# Patient Record
Sex: Female | Born: 1965 | Race: White | Hispanic: No | Marital: Married | Smoking: Current every day smoker
Health system: Southern US, Community
[De-identification: ages and names within clinical notes are randomized; demographics above are authoritative.]

## PROBLEM LIST (undated history)

## (undated) DIAGNOSIS — M069 Rheumatoid arthritis, unspecified: Secondary | ICD-10-CM

## (undated) DIAGNOSIS — E559 Vitamin D deficiency, unspecified: Secondary | ICD-10-CM

## (undated) HISTORY — PX: WISDOM TOOTH EXTRACTION: SHX21

---

## 2002-09-18 ENCOUNTER — Other Ambulatory Visit: Admission: RE | Admit: 2002-09-18 | Discharge: 2002-09-18 | Payer: Self-pay | Admitting: Obstetrics and Gynecology

## 2016-10-05 DIAGNOSIS — J069 Acute upper respiratory infection, unspecified: Secondary | ICD-10-CM | POA: Diagnosis not present

## 2017-01-26 DIAGNOSIS — Z01419 Encounter for gynecological examination (general) (routine) without abnormal findings: Secondary | ICD-10-CM | POA: Diagnosis not present

## 2017-01-26 DIAGNOSIS — Z6828 Body mass index (BMI) 28.0-28.9, adult: Secondary | ICD-10-CM | POA: Diagnosis not present

## 2017-01-26 DIAGNOSIS — Z1231 Encounter for screening mammogram for malignant neoplasm of breast: Secondary | ICD-10-CM | POA: Diagnosis not present

## 2017-03-05 ENCOUNTER — Telehealth: Payer: Self-pay

## 2017-03-05 ENCOUNTER — Ambulatory Visit: Payer: Self-pay | Admitting: Family Medicine

## 2017-03-05 NOTE — Telephone Encounter (Signed)
New pt called 03/05/17 at 2:58 pm to cancel new pt appt 03/05/17 at 3:30 pm ; reason given was car would not start. The appt has not been filled. Do you want to charge?

## 2017-03-05 NOTE — Telephone Encounter (Signed)
Copied from [place] [id number]. Topic: Quick Communication - Appointment Cancellation >> Mar 05, 2017  2:59 PM Conception Chancy, NT wrote: Patient called to cancel appointment scheduled for 03/05/2017. Patient has not rescheduled their appointment.    Route to department's PEC pool.

## 2017-03-05 NOTE — Telephone Encounter (Signed)
Will not charge for today's cancellation.

## 2018-04-01 ENCOUNTER — Ambulatory Visit (INDEPENDENT_AMBULATORY_CARE_PROVIDER_SITE_OTHER): Payer: BLUE CROSS/BLUE SHIELD | Admitting: Family Medicine

## 2018-04-01 ENCOUNTER — Encounter: Payer: Self-pay | Admitting: Family Medicine

## 2018-04-01 VITALS — BP 112/68 | HR 82 | Temp 98.3°F | Ht 64.75 in | Wt 173.0 lb

## 2018-04-01 DIAGNOSIS — Z720 Tobacco use: Secondary | ICD-10-CM

## 2018-04-01 DIAGNOSIS — R87619 Unspecified abnormal cytological findings in specimens from cervix uteri: Secondary | ICD-10-CM | POA: Insufficient documentation

## 2018-04-01 DIAGNOSIS — L659 Nonscarring hair loss, unspecified: Secondary | ICD-10-CM

## 2018-04-01 DIAGNOSIS — E663 Overweight: Secondary | ICD-10-CM | POA: Diagnosis not present

## 2018-04-01 DIAGNOSIS — Z0000 Encounter for general adult medical examination without abnormal findings: Secondary | ICD-10-CM | POA: Diagnosis not present

## 2018-04-01 DIAGNOSIS — E559 Vitamin D deficiency, unspecified: Secondary | ICD-10-CM

## 2018-04-01 DIAGNOSIS — Z1211 Encounter for screening for malignant neoplasm of colon: Secondary | ICD-10-CM

## 2018-04-01 DIAGNOSIS — Z23 Encounter for immunization: Secondary | ICD-10-CM

## 2018-04-01 LAB — CBC WITH DIFFERENTIAL/PLATELET
Basophils Absolute: 0 10*3/uL (ref 0.0–0.1)
Basophils Relative: 0.8 % (ref 0.0–3.0)
Eosinophils Absolute: 0 10*3/uL (ref 0.0–0.7)
Eosinophils Relative: 0.6 % (ref 0.0–5.0)
HCT: 41.8 % (ref 36.0–46.0)
HEMOGLOBIN: 14.3 g/dL (ref 12.0–15.0)
Lymphocytes Relative: 26.5 % (ref 12.0–46.0)
Lymphs Abs: 1.7 10*3/uL (ref 0.7–4.0)
MCHC: 34.1 g/dL (ref 30.0–36.0)
MCV: 91.4 fl (ref 78.0–100.0)
Monocytes Absolute: 0.4 10*3/uL (ref 0.1–1.0)
Monocytes Relative: 6.4 % (ref 3.0–12.0)
Neutro Abs: 4.2 10*3/uL (ref 1.4–7.7)
Neutrophils Relative %: 65.7 % (ref 43.0–77.0)
Platelets: 214 10*3/uL (ref 150.0–400.0)
RBC: 4.57 Mil/uL (ref 3.87–5.11)
RDW: 13.4 % (ref 11.5–15.5)
WBC: 6.4 10*3/uL (ref 4.0–10.5)

## 2018-04-01 LAB — COMPREHENSIVE METABOLIC PANEL
ALT: 22 U/L (ref 0–35)
AST: 15 U/L (ref 0–37)
Albumin: 4.4 g/dL (ref 3.5–5.2)
Alkaline Phosphatase: 72 U/L (ref 39–117)
BUN: 13 mg/dL (ref 6–23)
CO2: 26 mEq/L (ref 19–32)
CREATININE: 0.86 mg/dL (ref 0.40–1.20)
Calcium: 9.4 mg/dL (ref 8.4–10.5)
Chloride: 106 mEq/L (ref 96–112)
GFR: 73.06 mL/min (ref >60.00)
Glucose, Bld: 99 mg/dL (ref 70–99)
Potassium: 4.1 mEq/L (ref 3.5–5.1)
Sodium: 141 mEq/L (ref 135–145)
Total Bilirubin: 0.5 mg/dL (ref 0.2–1.2)
Total Protein: 6.9 g/dL (ref 6.0–8.3)

## 2018-04-01 LAB — LIPID PANEL
Cholesterol: 206 mg/dL — ABNORMAL HIGH (ref 0–200)
HDL: 49.3 mg/dL (ref >39.00)
LDL Cholesterol: 131 mg/dL — ABNORMAL HIGH (ref 0–99)
NonHDL: 157.03
Total CHOL/HDL Ratio: 4
Triglycerides: 128 mg/dL (ref 0.0–149.0)
VLDL: 25.6 mg/dL (ref 0.0–40.0)

## 2018-04-01 LAB — TSH: TSH: 1.66 u[IU]/mL (ref 0.35–4.50)

## 2018-04-01 LAB — VITAMIN D 25 HYDROXY (VIT D DEFICIENCY, FRACTURES): VITD: 18.07 ng/mL — ABNORMAL LOW (ref 30.00–100.00)

## 2018-04-01 NOTE — Patient Instructions (Signed)
Good to meet you today  I will send you a letter with your lab results  You will get a call about scheduling your colonoscopy  Please follow up for annual exam in 1 year   Coping with Quitting Smoking Quitting smoking is a physical and mental challenge. You will face cravings, withdrawal symptoms, and temptation. Before quitting, work with your health care provider to make a plan that can help you cope. Preparation can help you quit and keep you from giving in. How can I cope with cravings? Cravings usually last for 5-10 minutes. If you get through it, the craving will pass. Consider taking the following actions to help you cope with cravings:  Keep your mouth busy: ? Chew sugar-free gum. ? Suck on hard candies or a straw. ? Brush your teeth.  Keep your hands and body busy: ? Immediately change to a different activity when you feel a craving. ? Squeeze or play with a ball. ? Do an activity or a hobby, like making bead jewelry, practicing needlepoint, or working with wood. ? Mix up your normal routine. ? Take a short exercise break. Go for a quick walk or run up and down stairs. ? Spend time in public places where smoking is not allowed.  Focus on doing something kind or helpful for someone else.  Call a friend or family member to talk during a craving.  Join a support group.  Call a quit line, such as 1-800-QUIT-NOW.  Talk with your health care provider about medicines that might help you cope with cravings and make quitting easier for you.  How can I deal with withdrawal symptoms? Your body may experience negative effects as it tries to get used to not having nicotine in the system. These effects are called withdrawal symptoms. They may include:  Feeling hungrier than normal.  Trouble concentrating.  Irritability.  Trouble sleeping.  Feeling depressed.  Restlessness and agitation.  Craving a cigarette.  To manage withdrawal symptoms:  Avoid places, people, and  activities that trigger your cravings.  Remember why you want to quit.  Get plenty of sleep.  Avoid coffee and other caffeinated drinks. These may worsen some of your symptoms.  How can I handle social situations? Social situations can be difficult when you are quitting smoking, especially in the first few weeks. To manage this, you can:  Avoid parties, bars, and other social situations where people might be smoking.  Avoid alcohol.  Leave right away if you have the urge to smoke.  Explain to your family and friends that you are quitting smoking. Ask for understanding and support.  Plan activities with friends or family where smoking is not an option.  What are some ways I can cope with stress? Wanting to smoke may cause stress, and stress can make you want to smoke. Find ways to manage your stress. Relaxation techniques can help. For example:  Breathe slowly and deeply, in through your nose and out through your mouth.  Listen to soothing, relaxing music.  Talk with a family member or friend about your stress.  Light a candle.  Soak in a bath or take a shower.  Think about a peaceful place.  What are some ways I can prevent weight gain? Be aware that many people gain weight after they quit smoking. However, not everyone does. To keep from gaining weight, have a plan in place before you quit and stick to the plan after you quit. Your plan should include:  Having healthy  snacks. When you have a craving, it may help to: ? Eat plain popcorn, crunchy carrots, celery, or other cut vegetables. ? Chew sugar-free gum.  Changing how you eat: ? Eat small portion sizes at meals. ? Eat 4-6 small meals throughout the day instead of 1-2 large meals a day. ? Be mindful when you eat. Do not watch television or do other things that might distract you as you eat.  Exercising regularly: ? Make time to exercise each day. If you do not have time for a long workout, do short bouts of  exercise for 5-10 minutes several times a day. ? Do some form of strengthening exercise, like weight lifting, and some form of aerobic exercise, like running or swimming.  Drinking plenty of water or other low-calorie or no-calorie drinks. Drink 6-8 glasses of water daily, or as much as instructed by your health care provider.  Summary  Quitting smoking is a physical and mental challenge. You will face cravings, withdrawal symptoms, and temptation to smoke again. Preparation can help you as you go through these challenges.  You can cope with cravings by keeping your mouth busy (such as by chewing gum), keeping your body and hands busy, and making calls to family, friends, or a helpline for people who want to quit smoking.  You can cope with withdrawal symptoms by avoiding places where people smoke, avoiding drinks with caffeine, and getting plenty of rest.  Ask your health care provider about the different ways to prevent weight gain, avoid stress, and handle social situations. This information is not intended to replace advice given to you by your health care provider. Make sure you discuss any questions you have with your health care provider. Document Released: 04/14/2016 Document Revised: 04/14/2016 Document Reviewed: 04/14/2016 Elsevier Interactive Patient Education  2018 [place] Years, Female Preventive care refers to lifestyle choices and visits with your health care provider that can promote health and wellness. What does preventive care include?  A yearly physical exam. This is also called an annual well check.  Dental exams once or twice a year.  Routine eye exams. Ask your health care provider how often you should have your eyes checked.  Personal lifestyle choices, including: ? Daily care of your teeth and gums. ? Regular physical activity. ? Eating a healthy diet. ? Avoiding tobacco and drug use. ? Limiting alcohol use. ? Practicing safe  sex. ? Taking low-dose aspirin daily starting at age 32. ? Taking vitamin and mineral supplements as recommended by your health care provider. What happens during an annual well check? The services and screenings done by your health care provider during your annual well check will depend on your age, overall health, lifestyle risk factors, and family history of disease. Counseling Your health care provider may ask you questions about your:  Alcohol use.  Tobacco use.  Drug use.  Emotional well-being.  Home and relationship well-being.  Sexual activity.  Eating habits.  Work and work Statistician.  Method of birth control.  Menstrual cycle.  Pregnancy history.  Screening You may have the following tests or measurements:  Height, weight, and BMI.  Blood pressure.  Lipid and cholesterol levels. These may be checked every 5 years, or more frequently if you are over 1 years old.  Skin check.  Lung cancer screening. You may have this screening every year starting at age 59 if you have a 30-pack-year history of smoking and currently smoke or have quit within the past  15 years.  Fecal occult blood test (FOBT) of the stool. You may have this test every year starting at age 3.  Flexible sigmoidoscopy or colonoscopy. You may have a sigmoidoscopy every 5 years or a colonoscopy every 10 years starting at age 35.  Hepatitis C blood test.  Hepatitis B blood test.  Sexually transmitted disease (STD) testing.  Diabetes screening. This is done by checking your blood sugar (glucose) after you have not eaten for a while (fasting). You may have this done every 1-3 years.  Mammogram. This may be done every 1-2 years. Talk to your health care provider about when you should start having regular mammograms. This may depend on whether you have a family history of breast cancer.  BRCA-related cancer screening. This may be done if you have a family history of breast, ovarian, tubal, or  peritoneal cancers.  Pelvic exam and Pap test. This may be done every 3 years starting at age 74. Starting at age 63, this may be done every 5 years if you have a Pap test in combination with an HPV test.  Bone density scan. This is done to screen for osteoporosis. You may have this scan if you are at high risk for osteoporosis.  Discuss your test results, treatment options, and if necessary, the need for more tests with your health care provider. Vaccines Your health care provider may recommend certain vaccines, such as:  Influenza vaccine. This is recommended every year.  Tetanus, diphtheria, and acellular pertussis (Tdap, Td) vaccine. You may need a Td booster every 10 years.  Varicella vaccine. You may need this if you have not been vaccinated.  Zoster vaccine. You may need this after age 54.  Measles, mumps, and rubella (MMR) vaccine. You may need at least one dose of MMR if you were born in 1957 or later. You may also need a second dose.  Pneumococcal 13-valent conjugate (PCV13) vaccine. You may need this if you have certain conditions and were not previously vaccinated.  Pneumococcal polysaccharide (PPSV23) vaccine. You may need one or two doses if you smoke cigarettes or if you have certain conditions.  Meningococcal vaccine. You may need this if you have certain conditions.  Hepatitis A vaccine. You may need this if you have certain conditions or if you travel or work in places where you may be exposed to hepatitis A.  Hepatitis B vaccine. You may need this if you have certain conditions or if you travel or work in places where you may be exposed to hepatitis B.  Haemophilus influenzae type b (Hib) vaccine. You may need this if you have certain conditions.  Talk to your health care provider about which screenings and vaccines you need and how often you need them. This information is not intended to replace advice given to you by your health care provider. Make sure you  discuss any questions you have with your health care provider. Document Released: 05/14/2015 Document Revised: 01/05/2016 Document Reviewed: 02/16/2015 Elsevier Interactive Patient Education  [employer].

## 2018-04-01 NOTE — Progress Notes (Signed)
Subjective:    Patient ID: Jasmine Wade, female    DOB: Apr 19, 1966, 52 y.o.   MRN: [id number]  HPI This is a 52 year old female who presents today to establish care and for cpe. Has 7 kids, has two teens at home. Five grandchildren. She works in Press photographer for [employer]. Enjoys reading.    Last CPE- annual gyn, has appointment 04/07/18 Mammo- annual at gyn Pap- per gyn Colonoscopy- never, willing Tdap-unknown, today Flu- declines Eye- annual Dental- not recently Exercise- not regular, housework  History reviewed. No pertinent past medical history. History reviewed. No pertinent surgical history. Family History  Problem Relation Age of Onset  . Arthritis Mother    Social History   Tobacco Use  . Smoking status: Current Every Day Smoker  . Smokeless tobacco: Never Used  Substance Use Topics  . Alcohol use: Yes    Comment: occ  . Drug use: Not Currently    Comment: back in her 20's       Review of Systems  Constitutional: Negative.   HENT: Negative.   Eyes: Negative.   Respiratory: Negative.   Cardiovascular: Negative.   Gastrointestinal: Positive for diarrhea (occasional, no more than 1x/ daily, no blood, no mucus) and nausea (occasionally before diarrhea, always resolved with BM).  Endocrine: Negative.   Genitourinary: Negative.   Musculoskeletal: Negative.   Skin:       Mole on forehead x several years, per dermatology "black mole." No growth, no scaling or drainage/bleeding .  Allergic/Immunologic: Positive for environmental allergies (spring/fall, gets relief with otc antihistamines).  Neurological: Negative.   Hematological: Negative.   Psychiatric/Behavioral: Negative.        Objective:   Physical Exam Physical Exam  Constitutional: She is oriented to person, place, and time. She appears well-developed and well-nourished. No distress.  HENT:  Head: Normocephalic and atraumatic.  Right Ear: External ear normal.  Left Ear: External ear normal.    Nose: Nose normal.  Mouth/Throat: Oropharynx is clear and moist. No oropharyngeal exudate.  Eyes: Conjunctivae are normal. Pupils are equal, round, and reactive to light.  Neck: Normal range of motion. Neck supple. No JVD present. No thyromegaly present.  Cardiovascular: Normal rate, regular rhythm, normal heart sounds and intact distal pulses.   Pulmonary/Chest: Effort normal and breath sounds normal. Right breast exhibits no inverted nipple, no mass, no nipple discharge, no skin change and no tenderness. Left breast exhibits no inverted nipple, no mass, no nipple discharge, no skin change and no tenderness. Breasts are symmetrical.  Abdominal: Soft. Bowel sounds are normal. She exhibits no distension and no mass. There is no tenderness. There is no rebound and no guarding.  Musculoskeletal: Normal range of motion. She exhibits no edema or tenderness.  Lymphadenopathy:    She has no cervical adenopathy.  Neurological: She is alert and oriented to person, place, and time. She has normal reflexes.  Skin: Skin is warm and dry. She is not diaphoretic. Hair thin. Center of forehead at hairline with 2 mm round, dark, raised area.  Psychiatric: She has a normal mood and affect. Her behavior is normal. Judgment and thought content normal.  Vitals reviewed.   Blood pressure 112/68, pulse 82, temperature 98.3 F (36.8 C), temperature source Oral, height 5' 4.75" (1.645 m), weight 173 lb (78.5 kg), SpO2 98 %.      Assessment & Plan:  1. Annual physical exam - Discussed and encouraged healthy lifestyle choices- adequate sleep, regular exercise, stress management and healthy food choices.  2. Need for Tdap vaccination - Tdap vaccine greater than or equal to 7yo IM  3. Tobacco abuse - she is contemplating cessation, provided encouragement and written tips for success  4. Overweight (BMI 25.0-29.9) - Comprehensive metabolic panel - CBC with Differential - TSH - Vitamin D, 25-hydroxy - Lipid  Panel  5. Thinning hair - Comprehensive metabolic panel - CBC with Differential - TSH  6. Colon cancer screening - Ambulatory referral to Gastroenterology   Clarene Reamer, FNP-BC  [facility], [facility]  04/01/2018 2:02 PM

## 2018-04-03 ENCOUNTER — Encounter: Payer: Self-pay | Admitting: Family Medicine

## 2018-04-03 MED ORDER — VITAMIN D3 1.25 MG (50000 UT) PO TABS: 1.0000 | ORAL_TABLET | ORAL | 1 refills | Status: DC

## 2018-04-03 NOTE — Addendum Note (Signed)
Addended by: Clarene Reamer B on: 04/03/2018 11:35 AM   Modules accepted: Orders

## 2018-04-16 ENCOUNTER — Encounter: Payer: Self-pay | Admitting: Gastroenterology

## 2018-04-16 ENCOUNTER — Telehealth: Payer: Self-pay | Admitting: Family Medicine

## 2018-04-16 NOTE — Telephone Encounter (Signed)
Left message asking pt to call office regarding referral

## 2018-04-29 ENCOUNTER — Ambulatory Visit (AMBULATORY_SURGERY_CENTER): Payer: Self-pay

## 2018-04-29 VITALS — Ht 65.0 in | Wt 176.2 lb

## 2018-04-29 DIAGNOSIS — Z1211 Encounter for screening for malignant neoplasm of colon: Secondary | ICD-10-CM

## 2018-04-29 MED ORDER — NA SULFATE-K SULFATE-MG SULF 17.5-3.13-1.6 GM/177ML PO SOLN: 1.0000 | Freq: Once | ORAL | 0 refills | Status: AC

## 2018-04-29 NOTE — Progress Notes (Signed)
No egg or soy allergy known to patient  No issues with past sedation with any surgeries  or procedures, no intubation problems  No diet pills per patient No home 02 use per patient  No blood thinners per patient  Pt denies issues with constipation  No A fib or A flutter  EMMI video sent to pt's e mail  Pt. declined 

## 2018-05-06 ENCOUNTER — Encounter: Payer: Self-pay | Admitting: Gastroenterology

## 2018-05-14 ENCOUNTER — Encounter: Payer: Self-pay | Admitting: Gastroenterology

## 2018-05-14 ENCOUNTER — Ambulatory Visit (AMBULATORY_SURGERY_CENTER): Payer: BLUE CROSS/BLUE SHIELD | Admitting: Gastroenterology

## 2018-05-14 VITALS — BP 105/76 | HR 68 | Temp 97.5°F | Resp 13 | Ht 65.0 in | Wt 176.0 lb

## 2018-05-14 DIAGNOSIS — Z1211 Encounter for screening for malignant neoplasm of colon: Secondary | ICD-10-CM

## 2018-05-14 DIAGNOSIS — D123 Benign neoplasm of transverse colon: Secondary | ICD-10-CM

## 2018-05-14 DIAGNOSIS — D125 Benign neoplasm of sigmoid colon: Secondary | ICD-10-CM | POA: Diagnosis not present

## 2018-05-14 MED ORDER — SODIUM CHLORIDE 0.9 % IV SOLN: 500.0000 mL | Freq: Once | INTRAVENOUS | Status: DC

## 2018-05-14 NOTE — Progress Notes (Signed)
Pt's states no medical or surgical changes since previsit or office visit. 

## 2018-05-14 NOTE — Patient Instructions (Signed)
YOU HAD AN ENDOSCOPIC PROCEDURE TODAY AT [facility]:   Refer to the procedure report that was given to you for any specific questions about what was found during the examination.  If the procedure report does not answer your questions, please call your gastroenterologist to clarify.  If you requested that your care partner not be given the details of your procedure findings, then the procedure report has been included in a sealed envelope for you to review at your convenience later.  YOU SHOULD EXPECT: Some feelings of bloating in the abdomen. Passage of more gas than usual.  Walking can help get rid of the air that was put into your GI tract during the procedure and reduce the bloating. If you had a lower endoscopy (such as a colonoscopy or flexible sigmoidoscopy) you may notice spotting of blood in your stool or on the toilet paper. If you underwent a bowel prep for your procedure, you may not have a normal bowel movement for a few days.  Please Note:  You might notice some irritation and congestion in your nose or some drainage.  This is from the oxygen used during your procedure.  There is no need for concern and it should clear up in a day or so.  SYMPTOMS TO REPORT IMMEDIATELY:   Following lower endoscopy (colonoscopy or flexible sigmoidoscopy):  Excessive amounts of blood in the stool  Significant tenderness or worsening of abdominal pains  Swelling of the abdomen that is new, acute  Fever of 100F or higher   For urgent or emergent issues, a gastroenterologist can be reached at any hour by calling [phone].  No NSAIDs, ibuprofen, motrin or aleve for 2 weeks.   DIET:  We do recommend a small meal at first, but then you may proceed to your regular diet.  Drink plenty of fluids but you should avoid alcoholic beverages for 24 hours.  ACTIVITY:  You should plan to take it easy for the rest of today and you should NOT DRIVE or use heavy machinery until tomorrow  (because of the sedation medicines used during the test).    FOLLOW UP: Our staff will call the number listed on your records the next business day following your procedure to check on you and address any questions or concerns that you may have regarding the information given to you following your procedure. If we do not reach you, we will leave a message.  However, if you are feeling well and you are not experiencing any problems, there is no need to return our call.  We will assume that you have returned to your regular daily activities without incident.  If any biopsies were taken you will be contacted by phone or by letter within the next 1-3 weeks.  Please call us at [phone] if you have not heard about the biopsies in 3 weeks.    SIGNATURES/CONFIDENTIALITY: You and/or your care partner have signed paperwork which will be entered into your electronic medical record.  These signatures attest to the fact that that the information above on your After Visit Summary has been reviewed and is understood.  Full responsibility of the confidentiality of this discharge information lies with you and/or your care-partner.  Thank you for letting us take care of your healthcare needs today.

## 2018-05-14 NOTE — Op Note (Signed)
[facility] Patient Name: Jasmine Wade Procedure Date: 05/14/2018 11:58 AM MRN: [id number] Endoscopist: Mauri Pole , MD Age: 53 Referring MD:  Date of Birth: 05/17/1965 Gender: Female Account #: [id number] Procedure:                Colonoscopy Indications:              Screening for colorectal malignant neoplasm Medicines:                Monitored Anesthesia Care Procedure:                Pre-Anesthesia Assessment:                           - Prior to the procedure, a History and Physical                            was performed, and patient medications and                            allergies were reviewed. The patient's tolerance of                            previous anesthesia was also reviewed. The risks                            and benefits of the procedure and the sedation                            options and risks were discussed with the patient.                            All questions were answered, and informed consent                            was obtained. Prior Anticoagulants: The patient has                            taken no previous anticoagulant or antiplatelet                            agents. ASA Grade Assessment: I - A normal, healthy                            patient. After reviewing the risks and benefits,                            the patient was deemed in satisfactory condition to                            undergo the procedure.                           After obtaining informed consent, the colonoscope  was passed under direct vision. Throughout the                            procedure, the patient's blood pressure, pulse, and                            oxygen saturations were monitored continuously. The                            Colonoscope was introduced through the anus and                            advanced to the the cecum, identified by                            appendiceal orifice and  ileocecal valve. The                            colonoscopy was performed without difficulty. The                            patient tolerated the procedure well. The quality                            of the bowel preparation was fair. The ileocecal                            valve, appendiceal orifice, and rectum were                            photographed. Scope In: 12:05:41 PM Scope Out: 12:20:36 PM Scope Withdrawal Time: 0 hours 11 minutes 41 seconds  Total Procedure Duration: 0 hours 14 minutes 55 seconds  Findings:                 The perianal and digital rectal examinations were                            normal.                           Two sessile polyps were found in the sigmoid colon                            and transverse colon. The polyps were 1 to 2 mm in                            size. These polyps were removed with a cold biopsy                            forceps. Resection and retrieval were complete.                           A 4 mm polyp was found in the sigmoid colon. The  polyp was semi-pedunculated and oozing small amount                            of heme. The polyp was removed with a hot snare.                            Resection and retrieval were complete.                           A 5 mm polyp was found in the sigmoid colon. The                            polyp was sessile. The polyp was removed with a                            cold snare. Resection and retrieval were complete.                           Scattered small-mouthed diverticula were found in                            the sigmoid colon and descending colon.                           Non-bleeding internal hemorrhoids were found during                            retroflexion. The hemorrhoids were small.                           A single small localized angiodysplastic lesion                            without bleeding was found in the ascending colon. Complications:             No immediate complications. Estimated Blood Loss:     Estimated blood loss was minimal. Impression:               - Preparation of the colon was fair.                           - Two 1 to 2 mm polyps in the sigmoid colon and in                            the transverse colon, removed with a cold biopsy                            forceps. Resected and retrieved.                           - One 4 mm polyp in the sigmoid colon, removed with                            a hot  snare. Resected and retrieved.                           - One 5 mm polyp in the sigmoid colon, removed with                            a cold snare. Resected and retrieved.                           - Diverticulosis in the sigmoid colon and in the                            descending colon.                           - Non-bleeding internal hemorrhoids.                           - A single non-bleeding colonic angiodysplastic                            lesion. Recommendation:           - Patient has a contact number available for                            emergencies. The signs and symptoms of potential                            delayed complications were discussed with the                            patient. Return to normal activities tomorrow.                            Written discharge instructions were provided to the                            patient.                           - Resume previous diet.                           - Continue present medications.                           - Await pathology results.                           - Repeat colonoscopy in 1 year because the bowel                            preparation was suboptimal.                           - For future colonoscopy the patient will require  an extended preparation. If there are any                            questions, please contact the gastroenterologist.                           - No aspirin,  ibuprofen, naproxen, or other                            non-steroidal anti-inflammatory drugs for 2 weeks. Mauri Pole, MD 05/14/2018 12:30:06 PM This report has been signed electronically.

## 2018-05-14 NOTE — Progress Notes (Signed)
Called to room to assist during endoscopic procedure.  Patient ID and intended procedure confirmed with present staff. Received instructions for my participation in the procedure from the performing physician.  

## 2018-05-15 ENCOUNTER — Telehealth: Payer: Self-pay

## 2018-05-15 NOTE — Telephone Encounter (Signed)
  Follow up Call-  Call back number 05/14/2018  Post procedure Call Back phone  # [phone]  Permission to leave phone message Yes  Some recent data might be hidden    No ID on voicemail.  No message left.  Will try again around lunchtime.

## 2018-05-21 ENCOUNTER — Encounter: Payer: Self-pay | Admitting: Gastroenterology

## 2018-06-25 ENCOUNTER — Ambulatory Visit (HOSPITAL_COMMUNITY)
Admission: EM | Admit: 2018-06-25 | Discharge: 2018-06-25 | Disposition: A | Discharge status: Home / Self Care | Payer: BLUE CROSS/BLUE SHIELD | Attending: Emergency Medicine | Admitting: Emergency Medicine

## 2018-06-25 ENCOUNTER — Encounter (HOSPITAL_COMMUNITY): Payer: Self-pay

## 2018-06-25 DIAGNOSIS — H6532 Chronic mucoid otitis media, left ear: Secondary | ICD-10-CM

## 2018-06-25 DIAGNOSIS — J321 Chronic frontal sinusitis: Secondary | ICD-10-CM | POA: Diagnosis not present

## 2018-06-25 MED ORDER — PREDNISONE 10 MG (21) PO TBPK: ORAL_TABLET | Freq: Every day | ORAL | 0 refills | Status: DC

## 2018-06-25 MED ORDER — AMOXICILLIN 500 MG PO CAPS: 500.0000 mg | ORAL_CAPSULE | Freq: Three times a day (TID) | ORAL | 0 refills | Status: DC

## 2018-06-25 NOTE — ED Triage Notes (Signed)
Pt presents with sinus issues; facial pain/pressure, nasal drainage with green mucus, and ear pain X 8 days.

## 2018-06-25 NOTE — ED Provider Notes (Signed)
[facility]    CSN: [id number] Arrival date & time: 06/25/18  1708     History   Chief Complaint Chief Complaint  Patient presents with  . Sinus Issues    HPI Jasmine Wade is a 53 y.o. female.   Sinus pressure, fever, bil ear pain and pressure, generalized not feeling well. Has had chronic ear pain and sinus infections for years does see and ent. Has not been feeling well for the past 8 days now. Has tried otc meds with no relief.      History reviewed. No pertinent past medical history.  Patient Active Problem List   Diagnosis Date Noted  . Abnormal Pap smear of cervix 04/01/2018    Past Surgical History:  Procedure Laterality Date  . WISDOM TOOTH EXTRACTION      OB History   No obstetric history on file.      Home Medications    Prior to Admission medications   Medication Sig Start Date End Date Taking? Authorizing Provider  amoxicillin (AMOXIL) 500 MG capsule Take 1 capsule (500 mg total) by mouth 3 (three) times daily. 06/25/18   Marney Setting, NP  Cholecalciferol (VITAMIN D3) 1.25 MG (50000 UT) TABS Take 1 tablet by mouth every 7 (seven) days. Get blood work checked after taking for 6 months 04/03/18   Elby Beck, FNP  predniSONE (STERAPRED UNI-PAK 21 TAB) 10 MG (21) TBPK tablet Take by mouth daily. Take 6 tabs by mouth daily  for 2 days, then 5 tabs for 2 days, then 4 tabs for 2 days, then 3 tabs for 2 days, 2 tabs for 2 days, then 1 tab by mouth daily for 2 days 06/25/18   Marney Setting, NP    Family History Family History  Problem Relation Age of Onset  . Arthritis Mother   . Colon polyps Neg Hx   . Colon cancer Neg Hx   . Esophageal cancer Neg Hx   . Rectal cancer Neg Hx   . Stomach cancer Neg Hx     Social History Social History   Tobacco Use  . Smoking status: Current Every Day Smoker  . Smokeless tobacco: Never Used  Substance Use Topics  . Alcohol use: Yes    Comment: occ  . Drug use: Not Currently   Comment: back in her 20's      Allergies   Sulfa antibiotics   Review of Systems Review of Systems  Constitutional: Positive for fatigue and fever.  HENT: Positive for congestion, ear pain, sinus pressure and sinus pain.   Eyes: Negative.   Respiratory: Positive for cough.   Cardiovascular: Negative.   Gastrointestinal: Negative.   Genitourinary: Negative.   Musculoskeletal: Negative.   Skin: Negative.   Neurological: Negative.      Physical Exam Triage Vital Signs ED Triage Vitals  Enc Vitals Group     BP 06/25/18 1811 138/87     Pulse Rate 06/25/18 1811 88     Resp 06/25/18 1811 20     Temp 06/25/18 1811 98.4 F (36.9 C)     Temp Source 06/25/18 1811 Oral     SpO2 06/25/18 1811 100 %     Weight --      Height --      Head Circumference --      Peak Flow --      Pain Score 06/25/18 1812 7     Pain Loc --      Pain Edu? --  Excl. in GC? --    No data found.  Updated Vital Signs BP 138/87 (BP Location: Right Arm)   Pulse 88   Temp 98.4 F (36.9 C) (Oral)   Resp 20   SpO2 100%   Visual Acuity Right Eye Distance:   Left Eye Distance:   Bilateral Distance:    Right Eye Near:   Left Eye Near:    Bilateral Near:     Physical Exam HENT:     Right Ear: There is impacted cerumen.     Left Ear: There is impacted cerumen.     Ears:     Comments: bil ear buldging, erythema,     Mouth/Throat:     Mouth: Mucous membranes are moist.  Eyes:     Pupils: Pupils are equal, round, and reactive to light.  Neck:     Musculoskeletal: Normal range of motion.  Cardiovascular:     Rate and Rhythm: Normal rate.     Pulses: Normal pulses.  Pulmonary:     Effort: Pulmonary effort is normal.  Abdominal:     General: Abdomen is flat.  Skin:    General: Skin is warm.     Capillary Refill: Capillary refill takes less than 2 seconds.  Neurological:     Mental Status: She is alert.      UC Treatments / Results  Labs (all labs ordered are listed, but only  abnormal results are displayed) Labs Reviewed - No data to display  EKG None  Radiology No results found.  Procedures Procedures (including critical care time)  Medications Ordered in UC Medications - No data to display  Initial Impression / Assessment and Plan / UC Course  I have reviewed the triage vital signs and the nursing notes.  Pertinent labs & imaging results that were available during my care of the patient were reviewed by me and considered in my medical decision making (see chart for details).    Will need to see ent for further problems  Cont to take daily allergy meds  may use nasal spray to help   Final Clinical Impressions(s) / UC Diagnoses   Final diagnoses:  Chronic frontal sinusitis  Chronic mucoid otitis media of left ear   Discharge Instructions   None    ED Prescriptions    Medication Sig Dispense Auth. Provider   amoxicillin (AMOXIL) 500 MG capsule Take 1 capsule (500 mg total) by mouth 3 (three) times daily. 21 capsule Morley Kos L, NP   predniSONE (STERAPRED UNI-PAK 21 TAB) 10 MG (21) TBPK tablet Take by mouth daily. Take 6 tabs by mouth daily  for 2 days, then 5 tabs for 2 days, then 4 tabs for 2 days, then 3 tabs for 2 days, 2 tabs for 2 days, then 1 tab by mouth daily for 2 days 42 tablet Marney Setting, NP     Controlled Substance Prescriptions [place] Controlled Substance Registry consulted? Not Applicable   Marney Setting, NP 06/25/18 1901

## 2018-06-28 ENCOUNTER — Telehealth (HOSPITAL_COMMUNITY): Payer: Self-pay

## 2018-10-30 ENCOUNTER — Telehealth: Payer: Self-pay

## 2018-10-30 NOTE — Telephone Encounter (Signed)
Left detailed VM w COVID screen and back door lab info   

## 2018-10-31 ENCOUNTER — Other Ambulatory Visit: Payer: Self-pay

## 2018-10-31 ENCOUNTER — Other Ambulatory Visit (INDEPENDENT_AMBULATORY_CARE_PROVIDER_SITE_OTHER): Payer: 59

## 2018-10-31 DIAGNOSIS — E559 Vitamin D deficiency, unspecified: Secondary | ICD-10-CM | POA: Diagnosis not present

## 2018-10-31 LAB — VITAMIN D 25 HYDROXY (VIT D DEFICIENCY, FRACTURES): VITD: 34.01 ng/mL (ref 30.00–100.00)

## 2019-07-20 ENCOUNTER — Other Ambulatory Visit: Payer: Self-pay

## 2019-07-20 ENCOUNTER — Encounter (HOSPITAL_COMMUNITY): Payer: Self-pay | Admitting: Emergency Medicine

## 2019-07-20 ENCOUNTER — Emergency Department (HOSPITAL_COMMUNITY): Payer: Self-pay

## 2019-07-20 ENCOUNTER — Emergency Department (HOSPITAL_COMMUNITY)
Admission: EM | Admit: 2019-07-20 | Discharge: 2019-07-20 | Disposition: A | Discharge status: Home / Self Care | Payer: Self-pay | Attending: Emergency Medicine | Admitting: Emergency Medicine

## 2019-07-20 DIAGNOSIS — M79641 Pain in right hand: Secondary | ICD-10-CM

## 2019-07-20 DIAGNOSIS — M25561 Pain in right knee: Secondary | ICD-10-CM

## 2019-07-20 DIAGNOSIS — F17200 Nicotine dependence, unspecified, uncomplicated: Secondary | ICD-10-CM | POA: Insufficient documentation

## 2019-07-20 HISTORY — DX: Vitamin D deficiency, unspecified: E55.9

## 2019-07-20 MED ORDER — ACETAMINOPHEN 325 MG PO TABS
650.0000 mg | ORAL_TABLET | Freq: Once | ORAL | Status: AC
  Administered 2019-07-20: 650 mg via ORAL
  Filled 2019-07-20: qty 2

## 2019-07-20 NOTE — ED Provider Notes (Signed)
[facility] EMERGENCY DEPARTMENT Provider Note   CSN: [id number] Arrival date & time: 07/20/19  0747     History Chief Complaint  Patient presents with  . Knee Pain  . Hand numbness    Jasmine Wade is a 54 y.o. female.  Jasmine Wade is a 54 y.o. female with a history of vitamin D deficiency, who presents to the ED for evaluation of right knee pain.  Patient states this pain began yesterday and she denies any inciting injury or trauma to the knee.  Pain is well localized to the lateral aspect of the right knee at the joint line, she has not noticed any swelling, redness or warmth.  She denies any pain in the back of the knee or the calf.  She is able to bend and straighten the knee, and has been ambulatory on it although states is painful.  She states that she took 800 mg of ibuprofen with decent improvement in her discomfort.  She also reports that she has had some pain in her right hand over the past few weeks that is worse at night she states when she wakes up she often has a tingliness or numbness in her hand, she thinks it is most prevalent in the second and third fingers but is not entirely sure.  She denies any swelling or color change to the hand.  She states pain seems to get worse throughout the day and is especially worse at night.  No other aggravating or alleviating factors.  She has not seen her PCP or any other doctors regarding the symptoms.        Past Medical History:  Diagnosis Date  . Vitamin D deficiency     Patient Active Problem List   Diagnosis Date Noted  . Abnormal Pap smear of cervix 04/01/2018    Past Surgical History:  Procedure Laterality Date  . WISDOM TOOTH EXTRACTION       OB History   No obstetric history on file.     Family History  Problem Relation Age of Onset  . Arthritis Mother   . Colon polyps Neg Hx   . Colon cancer Neg Hx   . Esophageal cancer Neg Hx   . Rectal cancer Neg Hx   . Stomach cancer Neg Hx       Social History   Tobacco Use  . Smoking status: Current Every Day Smoker  . Smokeless tobacco: Never Used  Substance Use Topics  . Alcohol use: Yes    Comment: occ  . Drug use: Not Currently    Comment: back in her 20's     Home Medications Prior to Admission medications   Medication Sig Start Date End Date Taking? Authorizing Provider  amoxicillin (AMOXIL) 500 MG capsule Take 1 capsule (500 mg total) by mouth 3 (three) times daily. 06/25/18   Marney Setting, NP  Cholecalciferol (VITAMIN D3) 1.25 MG (50000 UT) TABS Take 1 tablet by mouth every 7 (seven) days. Get blood work checked after taking for 6 months 04/03/18   Elby Beck, FNP  predniSONE (STERAPRED UNI-PAK 21 TAB) 10 MG (21) TBPK tablet Take by mouth daily. Take 6 tabs by mouth daily  for 2 days, then 5 tabs for 2 days, then 4 tabs for 2 days, then 3 tabs for 2 days, 2 tabs for 2 days, then 1 tab by mouth daily for 2 days 06/25/18   Marney Setting, NP    Allergies  Sulfa antibiotics  Review of Systems   Review of Systems  Constitutional: Negative for chills and fever.  Cardiovascular: Negative for leg swelling.  Musculoskeletal: Positive for arthralgias and myalgias.  Skin: Negative for color change, rash and wound.  Neurological: Negative for weakness and numbness.       Tingling    Physical Exam Updated Vital Signs BP 114/86 (BP Location: Right Arm)   Pulse 83   Temp 98.3 F (36.8 C) (Oral)   Resp 14   SpO2 100%   Physical Exam Vitals and nursing note reviewed.  Constitutional:      General: She is not in acute distress.    Appearance: Normal appearance. She is well-developed and normal weight. She is not ill-appearing or diaphoretic.  HENT:     Head: Normocephalic and atraumatic.  Eyes:     General:        Right eye: No discharge.        Left eye: No discharge.  Pulmonary:     Effort: Pulmonary effort is normal. No respiratory distress.  Musculoskeletal:     Comments: Right  knee with localized point tenderness over the lateral aspect of the knee at the joint line, no palpable deformity, no overlying erythema, or swelling.  Patient is able to fully range the knee with some discomfort, distal pulses intact, no pain over the calf or posterior knee. Pain in the right hand is currently mild and not reproducible on exam, she does have some pain reproduced with Phalen's test, she has 2+ radial pulse and good capillary refill, no erythema, warmth, swelling or deformity noted to the hand.  5/5 strength, normal sensation.  Skin:    General: Skin is warm and dry.  Neurological:     Mental Status: She is alert and oriented to person, place, and time.     Coordination: Coordination normal.  Psychiatric:        Mood and Affect: Mood normal.        Behavior: Behavior normal.     ED Results / Procedures / Treatments   Labs (all labs ordered are listed, but only abnormal results are displayed) Labs Reviewed - No data to display  EKG None  Radiology DG Knee Complete 4 Views Right  Result Date: 07/20/2019 CLINICAL DATA:  Acute right knee pain without known injury. EXAM: RIGHT KNEE - COMPLETE 4+ VIEW COMPARISON:  None. FINDINGS: No evidence of fracture, dislocation, or joint effusion. No evidence of arthropathy or other focal bone abnormality. Soft tissues are unremarkable. IMPRESSION: Negative. Electronically Signed   By: Marijo Conception M.D.   On: 07/20/2019 09:52    Procedures Procedures (including critical care time)  Medications Ordered in ED Medications  acetaminophen (TYLENOL) tablet 650 mg (650 mg Oral Given 07/20/19 1010)    ED Course  I have reviewed the triage vital signs and the nursing notes.  Pertinent labs & imaging results that were available during my care of the patient were reviewed by me and considered in my medical decision making (see chart for details).    MDM Rules/Calculators/A&P                     54 year old female presents with right  knee pain, atraumatic, well localized to the lateral aspect of the knee with no swelling or deformity noted, no erythema or warmth and patient is able to range the knee greater than 90 degrees, I have low suspicion for septic arthritis or gout, feel this may  more likely be arthritis versus ligamentous injury or inflammation of the meniscus.  X-rays here in the ED are unremarkable.  Patient is ambulatory with some discomfort but pain improved with ibuprofen.  Will place in knee sleeve and have patient follow-up with sports medicine.  Patient also reports a few weeks of some discomfort to the right hand that is worse at night and associated with some tingling and paresthesias, I do wonder if this could be carpal tunnel, pain was somewhat reproduced with Phalen's test.  Will provide patient Velcro cock-up wrist splint, NSAIDs and Tylenol should help with both areas of pain.  Discussed appropriate follow-up and return precautions.  Patient discharged home in good condition.  Final Clinical Impression(s) / ED Diagnoses Final diagnoses:  Acute pain of right knee  Hand pain, right    Rx / DC Orders ED Discharge Orders    None       Janet Berlin 07/20/19 1026    Mesner, Corene Cornea, MD 07/20/19 1420

## 2019-07-20 NOTE — ED Triage Notes (Signed)
C/o R knee pain since yesterday. No known injury.  Also reports intermittent R hand numbness and pain to bilateral toes x 2 weeks.  States R hand numbness is worse at night with R arm aching. No neuro deficits noted on triage exam.

## 2019-07-20 NOTE — Discharge Instructions (Addendum)
Your evaluation today is reassuring, your x-rays of the knee were normal, this could be a problem with the meniscus or tendon or some arthritis, use knee sleeve for comfort and continue with ibuprofen and Tylenol.  The hip pain in your right hand may be due to carpal tunnel syndrome, use wrist splint provided, ibuprofen and Tylenol should help with this as well.  Follow-up with Dr. Barbaraann Barthel with sports medicine or your primary care doctor for further evaluation.

## 2019-07-20 NOTE — ED Notes (Signed)
Patient verbalizes understanding of discharge instructions. Opportunity for questioning and answers were provided. Armband removed by staff, pt discharged from ED.  

## 2019-07-20 NOTE — ED Notes (Signed)
Patient transported to X-ray 

## 2019-09-05 ENCOUNTER — Ambulatory Visit (INDEPENDENT_AMBULATORY_CARE_PROVIDER_SITE_OTHER): Payer: Self-pay | Admitting: Family Medicine

## 2019-09-05 ENCOUNTER — Encounter: Payer: Self-pay | Admitting: Family Medicine

## 2019-09-05 ENCOUNTER — Other Ambulatory Visit: Payer: Self-pay

## 2019-09-05 VITALS — BP 100/72 | HR 88 | Temp 97.7°F | Ht 65.0 in | Wt 176.8 lb

## 2019-09-05 DIAGNOSIS — R768 Other specified abnormal immunological findings in serum: Secondary | ICD-10-CM

## 2019-09-05 DIAGNOSIS — M130 Polyarthritis, unspecified: Secondary | ICD-10-CM

## 2019-09-05 DIAGNOSIS — M2550 Pain in unspecified joint: Secondary | ICD-10-CM

## 2019-09-05 DIAGNOSIS — M7989 Other specified soft tissue disorders: Secondary | ICD-10-CM

## 2019-09-05 LAB — CBC WITH DIFFERENTIAL/PLATELET
Basophils Absolute: 0.1 10*3/uL (ref 0.0–0.1)
Basophils Relative: 0.8 % (ref 0.0–3.0)
Eosinophils Absolute: 0.1 10*3/uL (ref 0.0–0.7)
Eosinophils Relative: 0.9 % (ref 0.0–5.0)
HCT: 43.6 % (ref 36.0–46.0)
Hemoglobin: 14.7 g/dL (ref 12.0–15.0)
Lymphocytes Relative: 16 % (ref 12.0–46.0)
Lymphs Abs: 1.3 10*3/uL (ref 0.7–4.0)
MCHC: 33.8 g/dL (ref 30.0–36.0)
MCV: 92.2 fl (ref 78.0–100.0)
Monocytes Absolute: 0.6 10*3/uL (ref 0.1–1.0)
Monocytes Relative: 7.3 % (ref 3.0–12.0)
Neutro Abs: 6.2 10*3/uL (ref 1.4–7.7)
Neutrophils Relative %: 75 % (ref 43.0–77.0)
Platelets: 224 10*3/uL (ref 150.0–400.0)
RBC: 4.73 Mil/uL (ref 3.87–5.11)
RDW: 13.5 % (ref 11.5–15.5)
WBC: 8.2 10*3/uL (ref 4.0–10.5)

## 2019-09-05 LAB — BASIC METABOLIC PANEL
BUN: 18 mg/dL (ref 6–23)
CO2: 30 mEq/L (ref 19–32)
Calcium: 10.6 mg/dL — ABNORMAL HIGH (ref 8.4–10.5)
Chloride: 104 mEq/L (ref 96–112)
Creatinine, Ser: 0.89 mg/dL (ref 0.40–1.20)
GFR: 65.72 mL/min (ref >60.00)
Glucose, Bld: 103 mg/dL — ABNORMAL HIGH (ref 70–99)
Potassium: 4.6 mEq/L (ref 3.5–5.1)
Sodium: 137 mEq/L (ref 135–145)

## 2019-09-05 LAB — SEDIMENTATION RATE: Sed Rate: 9 mm/hr (ref 0–30)

## 2019-09-05 NOTE — Progress Notes (Signed)
Subjective:    Patient ID: Jasmine Wade, female    DOB: 08-01-1965, 55 y.o.   MRN: [id number]  HPI Chief Complaint  Patient presents with  . Pain    C/O joint pain also pain in hands and feet with some swelling x 3 months becoming worse.   Lost job in 1/21. 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  Prior to current symptoms, had very rare joint pain. Over last 3 months has had pain in knees, hands, feet. Hand swelling x 1 month. Pain in base of toes. Active at home, house and yard work.  Occasionally takes ibuprofen 800 mg with some relief. Doesn't take every day. Little relief with heat/ice. No fevers or generalized muscle aches.  No family history of RA.    Review of Systems Per HPI    Objective:   Physical Exam Vitals reviewed.  Constitutional:      General: She is not in acute distress.    Appearance: Normal appearance. She is normal weight. She is not ill-appearing, toxic-appearing or diaphoretic.  HENT:     Head: Normocephalic and atraumatic.  Cardiovascular:     Rate and Rhythm: Normal rate.  Musculoskeletal:     Comments: Swelling in joints of bilateral hands, R>L. Decreased ROM secondary to swelling. Normal strength, normal color (no erythema), normal temperature, normal radial pulses.  Bilateral feet with minor swelling of toes, normal color, normal temperature, good ROM, normal DP/PT pulses.  No swelling noted in elbows, knees.   Skin:    General: Skin is warm and dry.     Findings: Lesion (small purple lesion on bottom of left foot, per patient, has been there for years without change. ) present.  Neurological:     Mental Status: She is alert and oriented to person, place, and time.  Psychiatric:        Mood and Affect: Mood normal.         Behavior: Behavior normal.        Thought Content: Thought content normal.        Judgment: Judgment normal.       BP 100/72   Pulse 88   Temp 97.7 F (36.5 C) (Tympanic)   Ht 5\' 5"  (1.651 m)   Wt 176 lb 12.8 oz (80.2 kg)   SpO2 96%   BMI 29.42 kg/m  Wt Readings from Last 3 Encounters:  09/05/19 176 lb 12.8 oz (80.2 kg)  05/14/18 176 lb (79.8 kg)  04/29/18 176 lb 3.2 oz (79.9 kg)       Assessment & Plan:  1. Swelling of both hands - unclear etiology, given abrupt onset and in multiple locations, will check labs - ANA - Rheumatoid factor; Future - Sedimentation rate - Basic Metabolic Panel - CBC with Differential  2. Arthralgia, unspecified joint - discussed otc analgesics, heat/ ice, elevation - ANA - Rheumatoid factor; Future - Sedimentation rate - Basic Metabolic Panel - CBC with Differential  3. Bilateral swelling of feet - ANA - Rheumatoid factor; Future - Sedimentation rate - Basic Metabolic Panel - CBC with Differential  This visit occurred during the SARS-CoV-2 public health emergency.  Safety protocols were in place, including screening questions prior to the visit, additional  usage of staff PPE, and extensive cleaning of exam room while observing appropriate contact time as indicated for disinfecting solutions.    Clarene Reamer, FNP-BC  [facility], [facility]  09/06/2019 10:44 AM

## 2019-09-05 NOTE — Patient Instructions (Signed)
Good to see you today  For pain, try splinting your hand, ibuprofen 400-800 every 8 to 12 hours as needed  I will notify you of test results and additional recommendations  Increase your water intake, try to decrease your salt intake

## 2019-09-06 ENCOUNTER — Encounter: Payer: Self-pay | Admitting: Family Medicine

## 2019-09-09 LAB — RHEUMATOID FACTOR: Rheumatoid fact SerPl-aCnc: 102 IU/mL — ABNORMAL HIGH (ref <14)

## 2019-09-09 LAB — ANTI-NUCLEAR AB-TITER (ANA TITER): ANA Titer 1: 1:80 {titer} — ABNORMAL HIGH

## 2019-09-09 LAB — ANA: Anti Nuclear Antibody (ANA): POSITIVE — AB

## 2019-09-09 NOTE — Addendum Note (Signed)
Addended by: Clarene Reamer B on: 09/09/2019 08:45 AM   Modules accepted: Orders

## 2020-05-27 DIAGNOSIS — Z1231 Encounter for screening mammogram for malignant neoplasm of breast: Secondary | ICD-10-CM | POA: Diagnosis not present

## 2020-05-27 DIAGNOSIS — Z01419 Encounter for gynecological examination (general) (routine) without abnormal findings: Secondary | ICD-10-CM | POA: Diagnosis not present

## 2020-05-27 DIAGNOSIS — Z6828 Body mass index (BMI) 28.0-28.9, adult: Secondary | ICD-10-CM | POA: Diagnosis not present

## 2020-05-31 DIAGNOSIS — M2550 Pain in unspecified joint: Secondary | ICD-10-CM | POA: Diagnosis not present

## 2020-05-31 DIAGNOSIS — R5382 Chronic fatigue, unspecified: Secondary | ICD-10-CM | POA: Diagnosis not present

## 2020-05-31 DIAGNOSIS — M0579 Rheumatoid arthritis with rheumatoid factor of multiple sites without organ or systems involvement: Secondary | ICD-10-CM | POA: Diagnosis not present

## 2020-06-02 DIAGNOSIS — L292 Pruritus vulvae: Secondary | ICD-10-CM | POA: Diagnosis not present

## 2020-06-02 DIAGNOSIS — N9089 Other specified noninflammatory disorders of vulva and perineum: Secondary | ICD-10-CM | POA: Diagnosis not present

## 2020-06-02 DIAGNOSIS — N909 Noninflammatory disorder of vulva and perineum, unspecified: Secondary | ICD-10-CM | POA: Diagnosis not present

## 2020-06-02 DIAGNOSIS — Z6828 Body mass index (BMI) 28.0-28.9, adult: Secondary | ICD-10-CM | POA: Diagnosis not present

## 2020-06-18 ENCOUNTER — Ambulatory Visit: Payer: Self-pay | Admitting: Family Medicine

## 2020-10-26 DIAGNOSIS — M2550 Pain in unspecified joint: Secondary | ICD-10-CM | POA: Diagnosis not present

## 2020-10-26 DIAGNOSIS — M0579 Rheumatoid arthritis with rheumatoid factor of multiple sites without organ or systems involvement: Secondary | ICD-10-CM | POA: Diagnosis not present

## 2020-10-26 DIAGNOSIS — R5382 Chronic fatigue, unspecified: Secondary | ICD-10-CM | POA: Diagnosis not present

## 2020-10-31 IMAGING — DX DG KNEE COMPLETE 4+V*R*
4 series · 4 of 4 positions shown · non-contrast
Comparison: None.

CLINICAL DATA: Acute right knee pain without known injury.

EXAM:
RIGHT KNEE - COMPLETE 4+ VIEW

[knee ap]
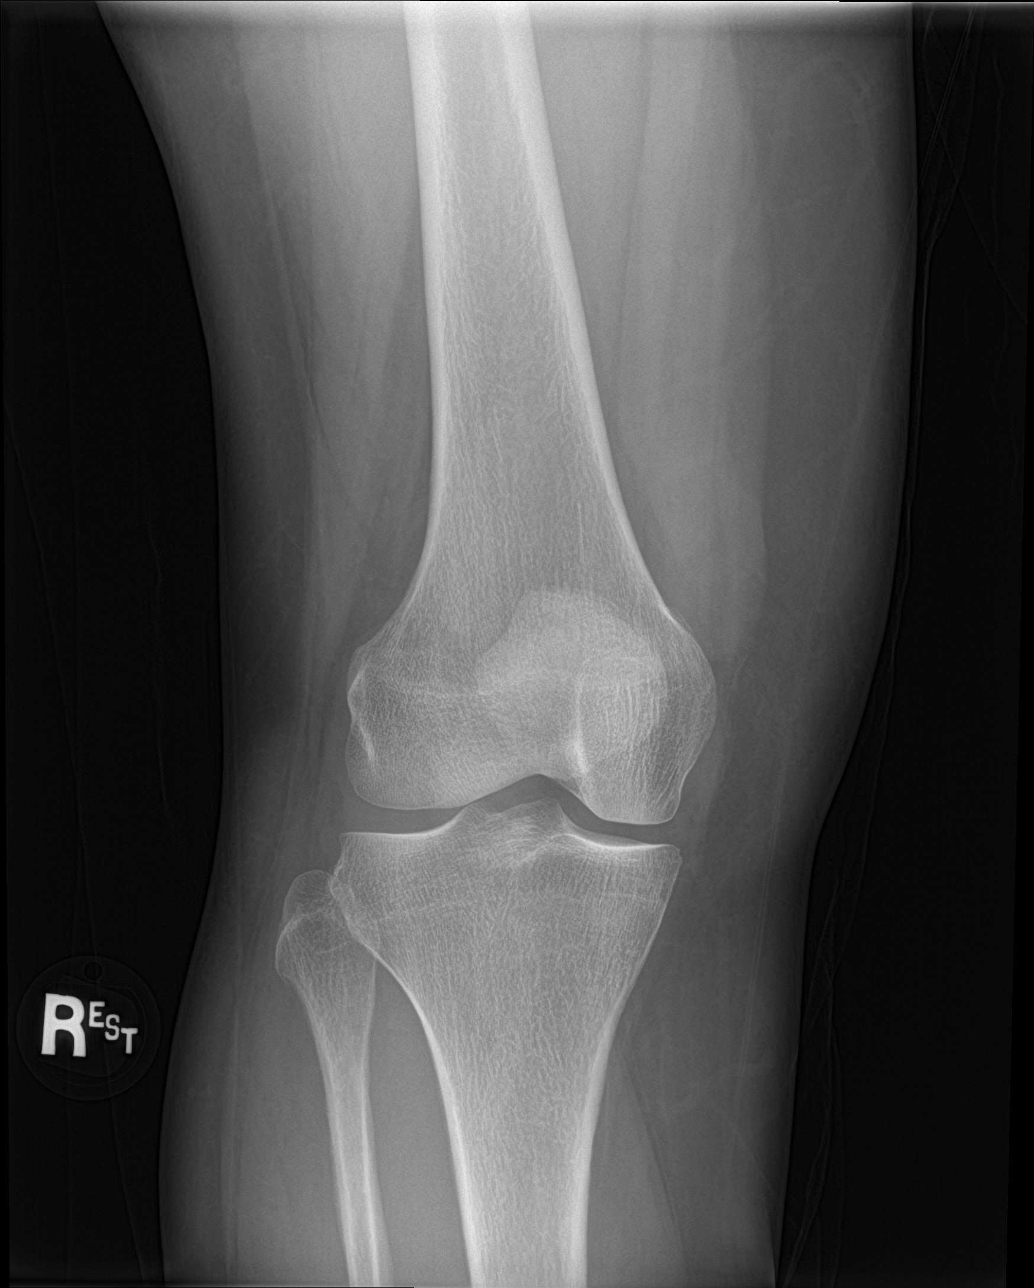

[knee lat]
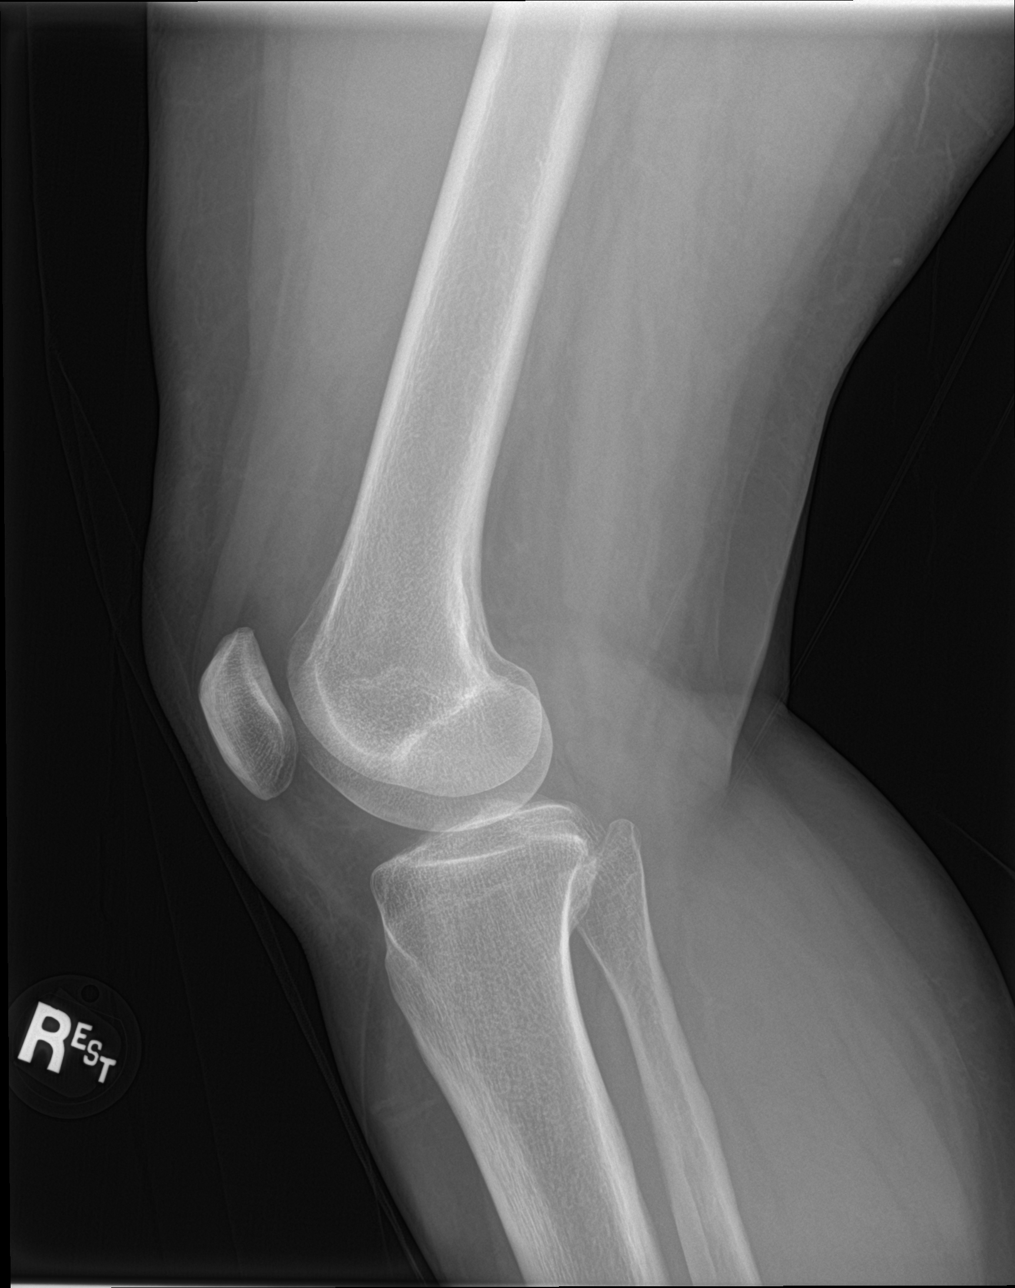

[knee obl (1 of 2)]
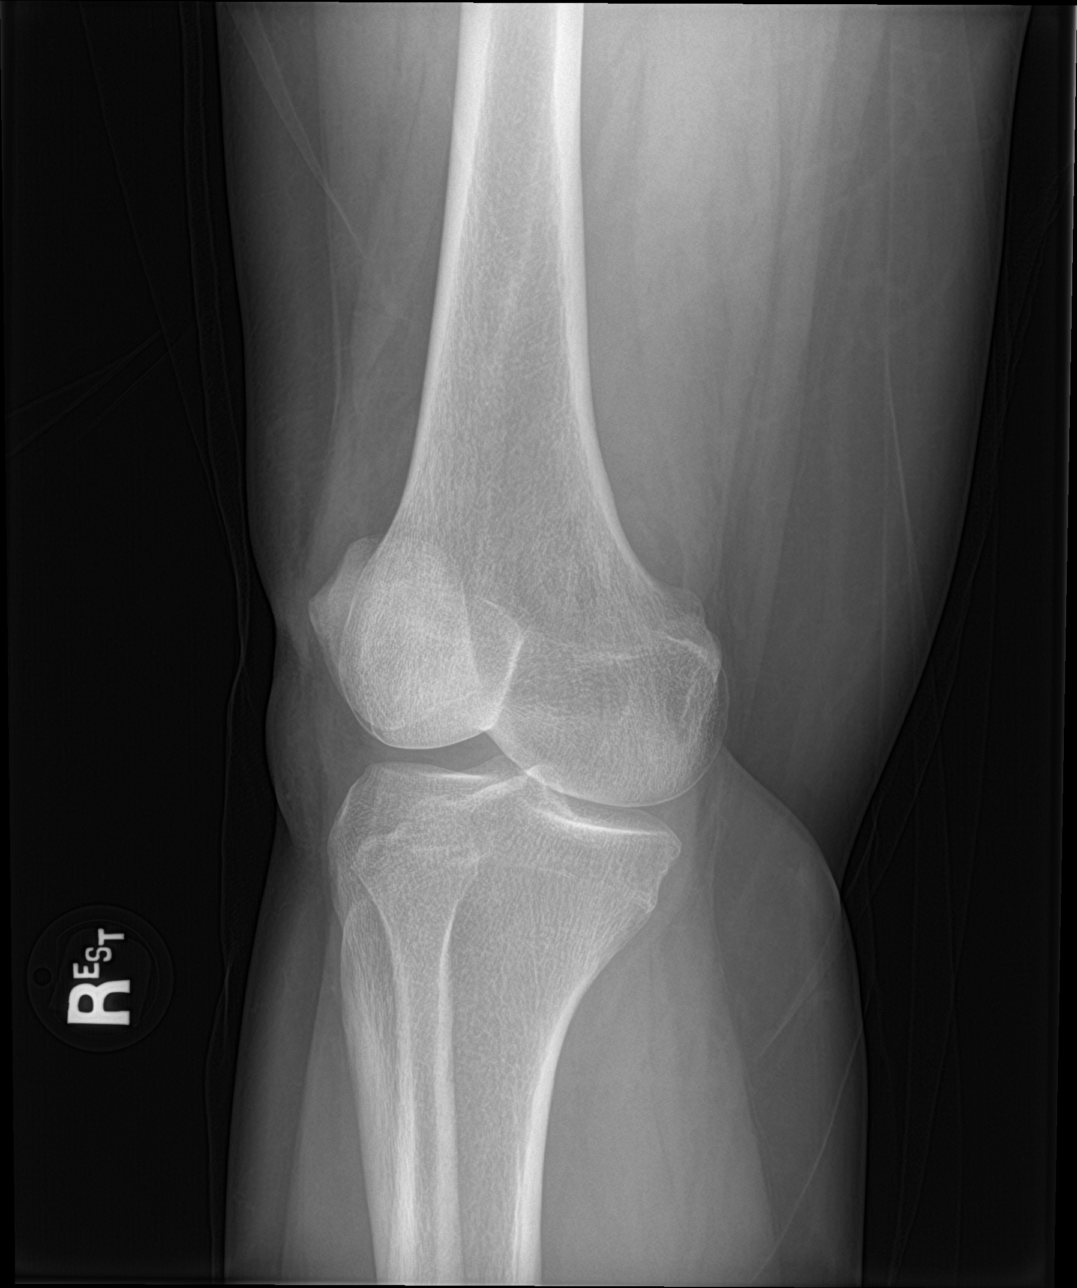

[knee obl (2 of 2)]
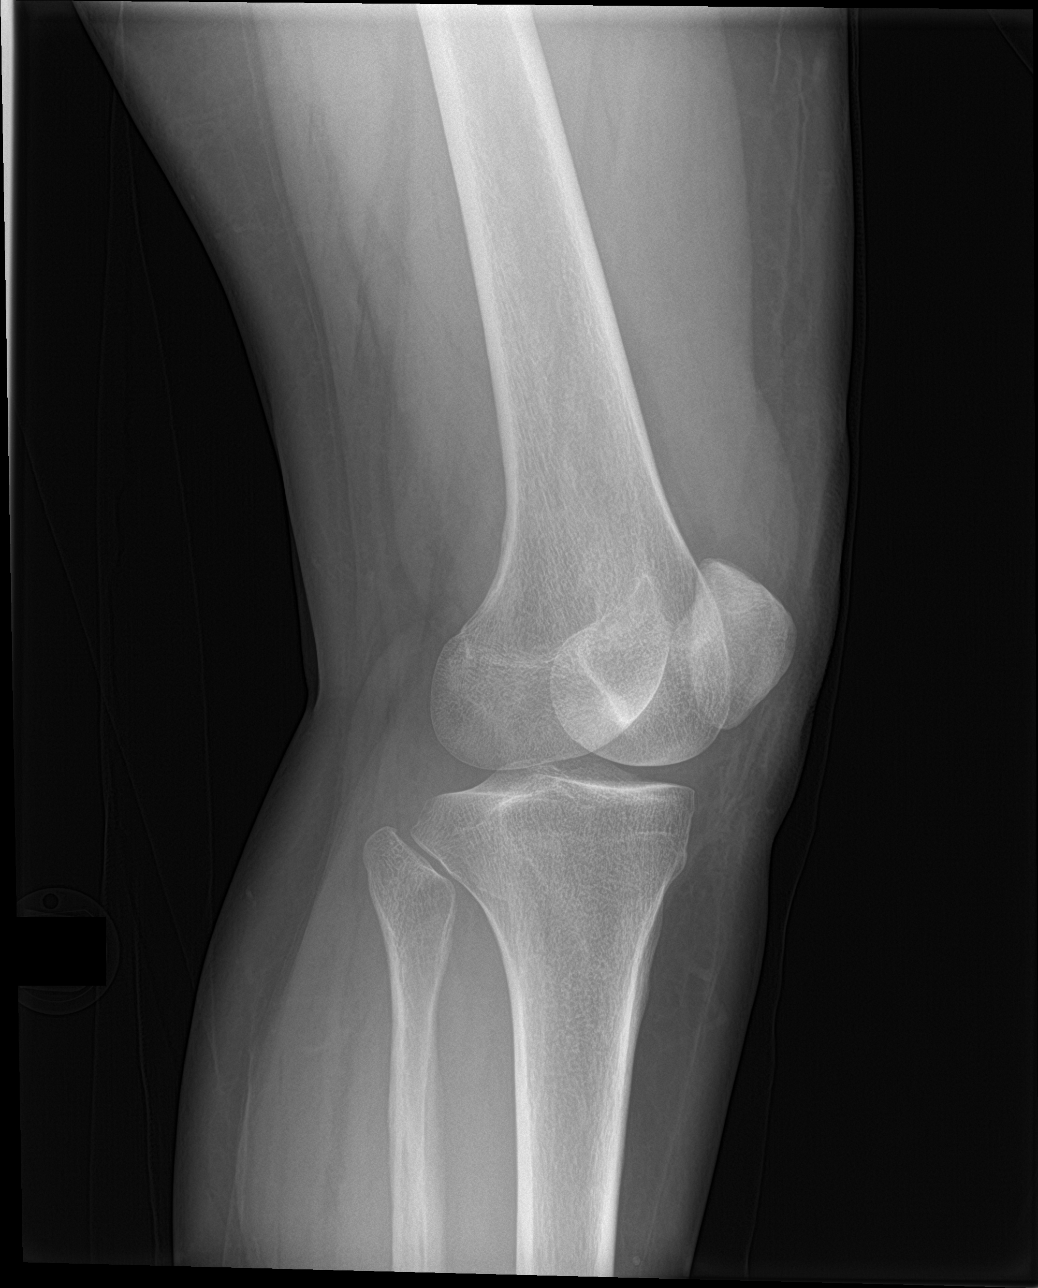

[4 of 4 positions shown; findings below may reference images not displayed]

FINDINGS: No evidence of fracture, dislocation, or joint effusion. No evidence
of arthropathy or other focal bone abnormality. Soft tissues are
unremarkable.
IMPRESSION: Negative.

## 2021-01-21 DIAGNOSIS — G4733 Obstructive sleep apnea (adult) (pediatric): Secondary | ICD-10-CM | POA: Diagnosis not present

## 2021-01-21 DIAGNOSIS — J342 Deviated nasal septum: Secondary | ICD-10-CM | POA: Diagnosis not present

## 2021-01-21 DIAGNOSIS — R0683 Snoring: Secondary | ICD-10-CM | POA: Diagnosis not present

## 2021-01-21 DIAGNOSIS — J343 Hypertrophy of nasal turbinates: Secondary | ICD-10-CM | POA: Diagnosis not present

## 2021-02-26 ENCOUNTER — Other Ambulatory Visit: Payer: Self-pay

## 2021-02-26 ENCOUNTER — Ambulatory Visit
Admission: RE | Admit: 2021-02-26 | Discharge: 2021-02-26 | Disposition: A | Discharge status: Home / Self Care | Payer: BC Managed Care – PPO | Source: Ambulatory Visit | Attending: Emergency Medicine | Admitting: Emergency Medicine

## 2021-02-26 VITALS — BP 125/78 | HR 96 | Temp 99.0°F | Resp 16

## 2021-02-26 DIAGNOSIS — J329 Chronic sinusitis, unspecified: Secondary | ICD-10-CM

## 2021-02-26 DIAGNOSIS — B9689 Other specified bacterial agents as the cause of diseases classified elsewhere: Secondary | ICD-10-CM

## 2021-02-26 MED ORDER — AMOXICILLIN-POT CLAVULANATE 875-125 MG PO TABS: 1.0000 | ORAL_TABLET | Freq: Two times a day (BID) | ORAL | 0 refills | Status: AC

## 2021-02-26 NOTE — ED Triage Notes (Signed)
Patient presents to Urgent Care with complaints of facial pressure, cough, and nasal congestion x 2 weeks. Not treating symptoms. Concerned with possible sinus infection.   Denies fever.

## 2021-02-26 NOTE — Discharge Instructions (Signed)
Sinusitis is an infection of the lining of the sinus cavities in your head. Sinusitis often follows a cold. It causes pain and pressure in your head and face.  Take antibiotics (Augmentin) as directed. Do not stop taking them just because you feel better. You need to take the full course of antibiotics. Rest, push lots of fluids (especially water), and utilize supportive care for symptoms. Breathe warm, moist area from a steamy shower, hot bath, or sink filled with hot water.  Avoid cold, dry air.  Using a humidifier in your home may help.  Follow the directions for cleaning the machine. Put a hot, wet towel or a warm gel pack on your face 3-4 times a day for 5-10 minutes each time. You may take acetaminophen (Tylenol) every 4-6 hours and ibuprofen every 6-8 hours for muscle pain, joint pain, headaches (you may also alternate these medications). Sudafed (pseudophedrine) is sold behind the counter and can help reduce nasal pressure; avoid taking this if you have high blood pressure or feel jittery. Sudafed PE (phenylephrine) can be a helpful, short-term, over-the-counter alternative to limit side effects or if you have high blood pressure.  Flonase nasal spray can help alleviate congestion and sinus pressure. Many patients choose Afrin as a nasal decongestant; do not use for more than 3 days for risk of rebound (increased symptoms after stopping medication).  Saline nasal sprays or rinses can also help nasal congestion (use bottled or sterile water). Warm tea with lemon and honey can sooth sore throat and cough, as can cough drops.   Return to clinic for new or worse swelling or redness in your face or around your eyes, or if you have a new or higher fever.

## 2021-02-26 NOTE — ED Provider Notes (Signed)
CHIEF COMPLAINT:   Chief Complaint  Patient presents with   Appointment    130   Facial Pain   Cough   Nasal Congestion     SUBJECTIVE/HPI:  HPI A very pleasant 55 y.o.Female presents today with facial pressure, cough and nasal congestion for 2 weeks.  Patient has not been treating symptoms.  She is concerned for potential sinus infection. Patient does not report any shortness of breath, chest pain, palpitations, visual changes, weakness, tingling, headache, nausea, vomiting, diarrhea, fever, chills.   has a past medical history of Vitamin D deficiency.  ROS:  Review of Systems See Subjective/HPI Medications, Allergies and Problem List personally reviewed in Epic today OBJECTIVE:   Vitals:   02/26/21 1412  BP: 125/78  Pulse: 96  Resp: 16  Temp: 99 F (37.2 C)  SpO2: 96%    Physical Exam   General: Appears well-developed and well-nourished. No acute distress.  HEENT Head: Normocephalic and atraumatic.  + frontal, maxillary sinus tenderness noted to palpation. Ears: Hearing grossly intact, no drainage or visible deformity.  Nose: No nasal deviation. Mouth/Throat: No stridor or tracheal deviation.  Mildly erythematous posterior pharynx noted with clear drainage present.  No white patchy exudate noted. Eyes: Conjunctivae and EOM are normal. No eye drainage or scleral icterus bilaterally.  Neck: Normal range of motion, neck is supple. No cervical, tonsillar or submandibular lymph nodes palpated. Cardiovascular: Normal rate. Regular rhythm; no murmurs, gallops, or rubs. Pulm/Chest: No respiratory distress. Breath sounds normal bilaterally without wheezes, rhonchi, or rales. Neurological: Alert and oriented to person, place, and time.  Skin: Skin is warm and dry.  No rashes, lesions, abrasions or bruising noted to skin.   Psychiatric: Normal mood, affect, behavior, and thought content.   Vital signs and nursing note reviewed.   Patient stable and cooperative with  examination. PROCEDURES:    LABS/X-RAYS/EKG/MEDS:   No results found for any visits on 02/26/21.  MEDICAL DECISION MAKING:   Patient presents with facial pressure, cough and nasal congestion for 2 weeks.  Patient has not been treating symptoms.  She is concerned for potential sinus infection. Patient does not report any shortness of breath, chest pain, palpitations, visual changes, weakness, tingling, headache, nausea, vomiting, diarrhea, fever, chills.  Given symptoms along with assessment findings, concern for bacterial sinusitis.  Rx'd Augmentin to the patient's preferred pharmacy and advised about home treatment and care as outlined in her AVS to include rest, fluids, nasal sprays, Tylenol versus ibuprofen as needed.  Please return for any new or worse swelling or redness in your face, around your eyes or new high fever.  Patient verbalized understanding and agreed with treatment plan.  Patient stable upon discharge. ASSESSMENT/PLAN:  1. Bacterial sinusitis - amoxicillin-clavulanate (AUGMENTIN) 875-125 MG tablet; Take 1 tablet by mouth every 12 (twelve) hours for 7 days.  Dispense: 14 tablet; Refill: 0 Instructions about new medications and side effects provided.  Plan:   Discharge Instructions      Sinusitis is an infection of the lining of the sinus cavities in your head. Sinusitis often follows a cold. It causes pain and pressure in your head and face.  Take antibiotics (Augmentin) as directed. Do not stop taking them just because you feel better. You need to take the full course of antibiotics. Rest, push lots of fluids (especially water), and utilize supportive care for symptoms. Breathe warm, moist area from a steamy shower, hot bath, or sink filled with hot water.  Avoid cold, dry air.  Using a  humidifier in your home may help.  Follow the directions for cleaning the machine. Put a hot, wet towel or a warm gel pack on your face 3-4 times a day for 5-10 minutes each time. You  may take acetaminophen (Tylenol) every 4-6 hours and ibuprofen every 6-8 hours for muscle pain, joint pain, headaches (you may also alternate these medications). Sudafed (pseudophedrine) is sold behind the counter and can help reduce nasal pressure; avoid taking this if you have high blood pressure or feel jittery. Sudafed PE (phenylephrine) can be a helpful, short-term, over-the-counter alternative to limit side effects or if you have high blood pressure.  Flonase nasal spray can help alleviate congestion and sinus pressure. Many patients choose Afrin as a nasal decongestant; do not use for more than 3 days for risk of rebound (increased symptoms after stopping medication).  Saline nasal sprays or rinses can also help nasal congestion (use bottled or sterile water). Warm tea with lemon and honey can sooth sore throat and cough, as can cough drops.   Return to clinic for new or worse swelling or redness in your face or around your eyes, or if you have a new or higher fever.          Jasmine Wade, [place] 02/26/21 1445

## 2022-03-29 DIAGNOSIS — M0579 Rheumatoid arthritis with rheumatoid factor of multiple sites without organ or systems involvement: Secondary | ICD-10-CM | POA: Diagnosis not present

## 2022-03-29 DIAGNOSIS — Z79899 Other long term (current) drug therapy: Secondary | ICD-10-CM | POA: Diagnosis not present

## 2022-05-23 DIAGNOSIS — R5382 Chronic fatigue, unspecified: Secondary | ICD-10-CM | POA: Diagnosis not present

## 2022-05-23 DIAGNOSIS — M0579 Rheumatoid arthritis with rheumatoid factor of multiple sites without organ or systems involvement: Secondary | ICD-10-CM | POA: Diagnosis not present

## 2022-08-22 DIAGNOSIS — M0579 Rheumatoid arthritis with rheumatoid factor of multiple sites without organ or systems involvement: Secondary | ICD-10-CM | POA: Diagnosis not present

## 2022-09-17 ENCOUNTER — Ambulatory Visit: Payer: Self-pay

## 2022-09-25 ENCOUNTER — Ambulatory Visit (HOSPITAL_COMMUNITY): Admit: 2022-09-25 | Payer: BC Managed Care – PPO

## 2022-10-03 DIAGNOSIS — R300 Dysuria: Secondary | ICD-10-CM | POA: Diagnosis not present

## 2022-10-03 DIAGNOSIS — E559 Vitamin D deficiency, unspecified: Secondary | ICD-10-CM | POA: Diagnosis not present

## 2022-10-03 DIAGNOSIS — Z87891 Personal history of nicotine dependence: Secondary | ICD-10-CM | POA: Diagnosis not present

## 2022-10-03 DIAGNOSIS — R5383 Other fatigue: Secondary | ICD-10-CM | POA: Diagnosis not present

## 2022-10-03 DIAGNOSIS — Z6829 Body mass index (BMI) 29.0-29.9, adult: Secondary | ICD-10-CM | POA: Diagnosis not present

## 2022-10-03 DIAGNOSIS — Z0000 Encounter for general adult medical examination without abnormal findings: Secondary | ICD-10-CM | POA: Diagnosis not present

## 2022-10-03 DIAGNOSIS — Z1159 Encounter for screening for other viral diseases: Secondary | ICD-10-CM | POA: Diagnosis not present

## 2022-10-03 DIAGNOSIS — Z131 Encounter for screening for diabetes mellitus: Secondary | ICD-10-CM | POA: Diagnosis not present

## 2022-10-03 DIAGNOSIS — F17200 Nicotine dependence, unspecified, uncomplicated: Secondary | ICD-10-CM | POA: Diagnosis not present

## 2022-10-10 DIAGNOSIS — F17200 Nicotine dependence, unspecified, uncomplicated: Secondary | ICD-10-CM | POA: Diagnosis not present

## 2022-10-10 DIAGNOSIS — E7800 Pure hypercholesterolemia, unspecified: Secondary | ICD-10-CM | POA: Diagnosis not present

## 2022-10-10 DIAGNOSIS — E559 Vitamin D deficiency, unspecified: Secondary | ICD-10-CM | POA: Diagnosis not present

## 2022-10-10 DIAGNOSIS — Z780 Asymptomatic menopausal state: Secondary | ICD-10-CM | POA: Diagnosis not present

## 2022-10-10 DIAGNOSIS — Z87891 Personal history of nicotine dependence: Secondary | ICD-10-CM | POA: Diagnosis not present

## 2022-10-10 DIAGNOSIS — Z6828 Body mass index (BMI) 28.0-28.9, adult: Secondary | ICD-10-CM | POA: Diagnosis not present

## 2022-10-31 DIAGNOSIS — Z01419 Encounter for gynecological examination (general) (routine) without abnormal findings: Secondary | ICD-10-CM | POA: Diagnosis not present

## 2022-10-31 DIAGNOSIS — Z1231 Encounter for screening mammogram for malignant neoplasm of breast: Secondary | ICD-10-CM | POA: Diagnosis not present

## 2022-11-21 DIAGNOSIS — R5382 Chronic fatigue, unspecified: Secondary | ICD-10-CM | POA: Diagnosis not present

## 2022-11-21 DIAGNOSIS — M0579 Rheumatoid arthritis with rheumatoid factor of multiple sites without organ or systems involvement: Secondary | ICD-10-CM | POA: Diagnosis not present

## 2022-12-19 DIAGNOSIS — L0201 Cutaneous abscess of face: Secondary | ICD-10-CM | POA: Diagnosis not present

## 2022-12-19 DIAGNOSIS — L080 Pyoderma: Secondary | ICD-10-CM | POA: Diagnosis not present

## 2023-02-20 DIAGNOSIS — R5382 Chronic fatigue, unspecified: Secondary | ICD-10-CM | POA: Diagnosis not present

## 2023-02-20 DIAGNOSIS — M0579 Rheumatoid arthritis with rheumatoid factor of multiple sites without organ or systems involvement: Secondary | ICD-10-CM | POA: Diagnosis not present

## 2023-04-13 DIAGNOSIS — R1010 Upper abdominal pain, unspecified: Secondary | ICD-10-CM | POA: Diagnosis not present

## 2023-04-13 DIAGNOSIS — Z860100 Personal history of colon polyps, unspecified: Secondary | ICD-10-CM | POA: Diagnosis not present

## 2023-04-13 DIAGNOSIS — R6881 Early satiety: Secondary | ICD-10-CM | POA: Diagnosis not present

## 2023-04-13 DIAGNOSIS — R140 Abdominal distension (gaseous): Secondary | ICD-10-CM | POA: Diagnosis not present

## 2023-04-19 DIAGNOSIS — R6881 Early satiety: Secondary | ICD-10-CM | POA: Diagnosis not present

## 2023-04-19 DIAGNOSIS — Z1211 Encounter for screening for malignant neoplasm of colon: Secondary | ICD-10-CM | POA: Diagnosis not present

## 2023-04-30 DIAGNOSIS — K200 Eosinophilic esophagitis: Secondary | ICD-10-CM | POA: Diagnosis not present

## 2023-05-22 DIAGNOSIS — M0579 Rheumatoid arthritis with rheumatoid factor of multiple sites without organ or systems involvement: Secondary | ICD-10-CM | POA: Diagnosis not present

## 2023-05-22 DIAGNOSIS — R5382 Chronic fatigue, unspecified: Secondary | ICD-10-CM | POA: Diagnosis not present

## 2023-08-20 DIAGNOSIS — M0579 Rheumatoid arthritis with rheumatoid factor of multiple sites without organ or systems involvement: Secondary | ICD-10-CM | POA: Diagnosis not present

## 2023-08-23 DIAGNOSIS — R7989 Other specified abnormal findings of blood chemistry: Secondary | ICD-10-CM | POA: Diagnosis not present

## 2023-08-29 DIAGNOSIS — R7989 Other specified abnormal findings of blood chemistry: Secondary | ICD-10-CM | POA: Diagnosis not present

## 2023-09-07 DIAGNOSIS — K449 Diaphragmatic hernia without obstruction or gangrene: Secondary | ICD-10-CM | POA: Diagnosis not present

## 2023-09-07 DIAGNOSIS — M0579 Rheumatoid arthritis with rheumatoid factor of multiple sites without organ or systems involvement: Secondary | ICD-10-CM | POA: Diagnosis not present

## 2023-09-07 DIAGNOSIS — R7989 Other specified abnormal findings of blood chemistry: Secondary | ICD-10-CM | POA: Diagnosis not present

## 2023-09-07 DIAGNOSIS — R5382 Chronic fatigue, unspecified: Secondary | ICD-10-CM | POA: Diagnosis not present

## 2023-09-15 ENCOUNTER — Emergency Department (HOSPITAL_COMMUNITY)

## 2023-09-15 ENCOUNTER — Emergency Department (HOSPITAL_COMMUNITY)
Admission: EM | Admit: 2023-09-15 | Discharge: 2023-09-15 | Disposition: A | Discharge status: Home / Self Care | Attending: Emergency Medicine | Admitting: Emergency Medicine

## 2023-09-15 ENCOUNTER — Encounter (HOSPITAL_COMMUNITY): Payer: Self-pay | Admitting: Emergency Medicine

## 2023-09-15 ENCOUNTER — Other Ambulatory Visit: Payer: Self-pay

## 2023-09-15 DIAGNOSIS — F17210 Nicotine dependence, cigarettes, uncomplicated: Secondary | ICD-10-CM | POA: Diagnosis not present

## 2023-09-15 DIAGNOSIS — K851 Biliary acute pancreatitis without necrosis or infection: Secondary | ICD-10-CM | POA: Insufficient documentation

## 2023-09-15 DIAGNOSIS — R1013 Epigastric pain: Secondary | ICD-10-CM | POA: Diagnosis not present

## 2023-09-15 DIAGNOSIS — R0789 Other chest pain: Secondary | ICD-10-CM | POA: Diagnosis not present

## 2023-09-15 DIAGNOSIS — R7401 Elevation of levels of liver transaminase levels: Secondary | ICD-10-CM | POA: Diagnosis not present

## 2023-09-15 DIAGNOSIS — R748 Abnormal levels of other serum enzymes: Secondary | ICD-10-CM | POA: Insufficient documentation

## 2023-09-15 DIAGNOSIS — R079 Chest pain, unspecified: Secondary | ICD-10-CM | POA: Diagnosis not present

## 2023-09-15 DIAGNOSIS — R1011 Right upper quadrant pain: Secondary | ICD-10-CM | POA: Diagnosis not present

## 2023-09-15 LAB — CBC
HCT: 39.6 % (ref 36.0–46.0)
Hemoglobin: 13.5 g/dL (ref 12.0–15.0)
MCH: 31.3 pg (ref 26.0–34.0)
MCHC: 34.1 g/dL (ref 30.0–36.0)
MCV: 91.7 fL (ref 80.0–100.0)
Platelets: 188 10*3/uL (ref 150–400)
RBC: 4.32 MIL/uL (ref 3.87–5.11)
RDW: 13.2 % (ref 11.5–15.5)
WBC: 8.1 10*3/uL (ref 4.0–10.5)
nRBC: 0 % (ref 0.0–0.2)

## 2023-09-15 LAB — BASIC METABOLIC PANEL WITH GFR
Anion gap: 8 (ref 5–15)
BUN: 13 mg/dL (ref 6–20)
CO2: 29 mmol/L (ref 22–32)
Calcium: 9.8 mg/dL (ref 8.9–10.3)
Chloride: 105 mmol/L (ref 98–111)
Creatinine, Ser: 0.95 mg/dL (ref 0.44–1.00)
GFR, Estimated: >60 mL/min (ref >60)
Glucose, Bld: 110 mg/dL — ABNORMAL HIGH (ref 70–99)
Potassium: 4 mmol/L (ref 3.5–5.1)
Sodium: 142 mmol/L (ref 135–145)

## 2023-09-15 LAB — HEPATIC FUNCTION PANEL
ALT: 118 U/L — ABNORMAL HIGH (ref 0–44)
AST: 287 U/L — ABNORMAL HIGH (ref 15–41)
Albumin: 3.5 g/dL (ref 3.5–5.0)
Alkaline Phosphatase: 86 U/L (ref 38–126)
Bilirubin, Direct: 0.4 mg/dL — ABNORMAL HIGH (ref 0.0–0.2)
Indirect Bilirubin: 0.6 mg/dL (ref 0.3–0.9)
Total Bilirubin: 1 mg/dL (ref 0.0–1.2)
Total Protein: 6.3 g/dL — ABNORMAL LOW (ref 6.5–8.1)

## 2023-09-15 LAB — TROPONIN I (HIGH SENSITIVITY)
Troponin I (High Sensitivity): 4 ng/L (ref <18)
Troponin I (High Sensitivity): 4 ng/L (ref <18)

## 2023-09-15 LAB — LIPASE, BLOOD: Lipase: 199 U/L — ABNORMAL HIGH (ref 11–51)

## 2023-09-15 MED ORDER — SODIUM CHLORIDE 0.9 % IV BOLUS
1000.0000 mL | Freq: Once | INTRAVENOUS | Status: AC
  Administered 2023-09-15: 1000 mL via INTRAVENOUS

## 2023-09-15 MED ORDER — KETOROLAC TROMETHAMINE 15 MG/ML IJ SOLN
15.0000 mg | Freq: Once | INTRAMUSCULAR | Status: AC
  Administered 2023-09-15: 15 mg via INTRAVENOUS
  Filled 2023-09-15: qty 1

## 2023-09-15 NOTE — ED Notes (Signed)
 William in lab to add on hepatic function panel.

## 2023-09-15 NOTE — ED Notes (Signed)
 Patient transported to Ultrasound

## 2023-09-15 NOTE — Discharge Instructions (Addendum)
 Jasmine Wade:  Thank you for allowing us  to take care of you today.  We hope you begin feeling better soon. You were seen today for RUQ pain.  It appears that you have pancreatitis.  You also have evidence of gallstones in your gallbladder.  We do feel that you need a surgical consultation.  I did discuss with the surgeons and they have recommended you come to the office to make an appointment.  Please see above contact information.  If you develop worsening abdominal pain and is persistent for 6 to 8 hours please return to the emergency department.  To-Do:  Please follow-up with your primary doctor within the next 2-3 days. It is important that you review any labs or imaging results (if any) that you had today with them. Your preliminary imaging results (if any) are attached. Please return to the Emergency Department or call 911 if you experience chest pain, shortness of breath, severe pain, severe fever, altered mental status, or have any reason to think that you need emergency medical care.  Thank you again.  Hope you feel better soon.  Arminda Landmark, MD Department of Emergency Medicine

## 2023-09-15 NOTE — ED Provider Notes (Signed)
 [place] EMERGENCY DEPARTMENT AT [facility] Provider Note  History  Chief Complaint:  Abdominal Pain and Chest Pain   Abdominal Pain Associated symptoms: chest pain   Chest Pain Associated symptoms: abdominal pain      Jasmine Wade is a 58 y.o. female with no significant past medical history presents the emergency department for epigastric abdominal pain that goes around the back and chest for a couple of months.  She reports that is happen 3 times a day that was worse today.  She also reports some shortness of breath with the abdominal pain.  Describes the pain as a tightness and full feeling in her abdomen.  Current medications include: Jasmine Wade (stopped 3 weeks ago); no other medications   Past Medical History:  Diagnosis Date   Vitamin D  deficiency     Past Surgical History:  Procedure Laterality Date   WISDOM TOOTH EXTRACTION      Family History  Problem Relation Age of Onset   Arthritis Mother    Colon polyps Neg Hx    Colon cancer Neg Hx    Esophageal cancer Neg Hx    Rectal cancer Neg Hx    Stomach cancer Neg Hx     Social History   Tobacco Use   Smoking status: Every Day    Types: Cigarettes   Smokeless tobacco: Never  Vaping Use   Vaping status: Never Used  Substance Use Topics   Alcohol use: Yes    Comment: occ   Drug use: Not Currently    Comment: back in her 20's     Review of Systems  Review of Systems  Cardiovascular:  Positive for chest pain.  Gastrointestinal:  Positive for abdominal pain.     Reviewed and documented in HPI if pertinent.   Physical Exam   ED Triage Vitals  Encounter Vitals Group     BP 09/15/23 1451 (!) 142/95     Systolic BP Percentile --      Diastolic BP Percentile --      Pulse Rate 09/15/23 1451 94     Resp 09/15/23 1451 16     Temp 09/15/23 1451 98 F (36.7 C)     Temp src --      SpO2 09/15/23 1451 100 %     Weight 09/15/23 1451 163 lb (73.9 kg)     Height 09/15/23 1451 5\' 5"  (1.651 m)      Head Circumference --      Peak Flow --      Pain Score 09/15/23 1451 4     Pain Loc --      Pain Education --      Exclude from Growth Chart --      Physical Exam Vitals and nursing note reviewed.  Constitutional:      General: She is not in acute distress.    Appearance: She is well-developed.  HENT:     Head: Normocephalic and atraumatic.  Eyes:     Conjunctiva/sclera: Conjunctivae normal.  Cardiovascular:     Rate and Rhythm: Normal rate and regular rhythm.     Heart sounds: No murmur heard. Pulmonary:     Effort: Pulmonary effort is normal. No respiratory distress.     Breath sounds: Normal breath sounds.  Abdominal:     Palpations: Abdomen is soft.     Tenderness: There is abdominal tenderness in the epigastric area.  Musculoskeletal:        General: No swelling.  Cervical back: Neck supple.  Skin:    General: Skin is warm and dry.     Capillary Refill: Capillary refill takes less than 2 seconds.  Neurological:     Mental Status: She is alert.  Psychiatric:        Mood and Affect: Mood normal.      Procedures   Procedures  ED Course - Medical Decision Making  Brief Overview Jasmine Wade is a 58 y.o. female who presents as per above.  I have reviewed the nursing documentation for past medical history, family history, and social history and agree.  I have reviewed the patient's vital signs. Mild HTN.  Initial Differential Diagnoses: I am primarily concerned for pancreatitis, cholecystitis, lower lobe pneumonia, hepatitis, electrolyte abnormalities.  Therapies: These medications and interventions were provided for the patient while in the ED.  Medications  sodium chloride  0.9 % bolus 1,000 mL (0 mLs Intravenous Stopped 09/15/23 1857)  ketorolac (TORADOL) 15 MG/ML injection 15 mg (15 mg Intravenous Given 09/15/23 1704)    Testing Results: On my interpretation labs are significant for : Lipase 199 Trop 4 No leukocytosis  On my  interpretation imaging is significant for: CXR without opacity US  RUQ evidence of cholelithiasis  EKG Interpretation Date/Time:  Saturday Sep 15 2023 14:55:54 EDT Ventricular Rate:  93 PR Interval:  122 QRS Duration:  80 QT Interval:  354 QTC Calculation: 440 R Axis:   48  Text Interpretation: Normal sinus rhythm Nonspecific T wave abnormality Abnormal ECG No previous ECGs available Confirmed by Jasmine Wade [phone]) on 09/15/2023 3:51:09 PM   See the EMR for full details regarding lab and imaging results.   Medical Decision Making 58 year old female who presents the emergency department for epigastric abdominal pain.  On exam patient does have focal epigastric tenderness.  She states that this pain goes through to her back.  She reports no leg swelling.  No chest pain or shortness of breath.  No nausea or vomiting.  Does have a history of RA and recently discontinued her medicine approximately 3 weeks ago.  She states that she would did have elevated liver enzymes and they are currently downtrending.  On review of laboratory studies patient does have a transaminitis.  She does have elevated lipase.  Normal bilirubin.  Troponin negative.  No leukocytosis.  Chest x-ray without opacity.  Given exam as well as pancreatitis we did perform right upper quadrant ultrasound.  This revealed cholelithiasis without evidence of CBD dilation.  I discussed with Dr. Elvan Wade with general surgery.  We discussed that the patient does not have a dilated CBD or elevated bilirubin.  We did discuss that her transaminitis is downtrending.  I am concerned the patient is suffering from acute gallstone pancreatitis.  However there is no signs of obstruction currently.  Patient reports that her pain is well-controlled here in the emergency department.  She does not appear to be dehydrated.  She has tolerated p.o. here in the emergency department.   I discussed with the patient regarding disposition.  She states that she  would like to go home.  We discussed that given that she is tolerating p.o. and her pain is well-controlled this is reasonable.  I discussed with general surgery and he states to call his office on Monday morning to request an appointment.  I discussed this with the patient and she is agreeable to this plan.  We did give return precautions in the after visit summary.  Problems Addressed: Gallstone pancreatitis: acute  illness or injury that poses a threat to life or bodily functions  Amount and/or Complexity of Data Reviewed Labs: ordered. Radiology: ordered.  Risk Prescription drug management.     ### All radiography studies, electrocardiograms, and laboratory data were personally reviewed by me and incorporated into my medical decision making. Impression   1. Gallstone pancreatitis      Note: Dragon medical dictation software was used in the creation of this note.     Arminda Landmark, MD 09/15/23 Isidoro Margarita    Dalene Duck, MD 09/16/23 [phone]

## 2023-09-15 NOTE — ED Triage Notes (Signed)
 Pt endorses upper abd pain that goes around her back and chest for a few months. 3 episodes today. Mild SOB with the pain. Describes the pain as a tightness and full feeling.

## 2023-09-15 NOTE — ED Notes (Signed)
 Patient alert, oriented, and ambulatory at time of discharge. Patient verbalized an understanding of discharge instructions and left with significant other at time of discharge. No c/o pain or signs of respiratory distress at this time.

## 2023-09-18 ENCOUNTER — Ambulatory Visit: Admitting: Nurse Practitioner

## 2023-09-18 DIAGNOSIS — K851 Biliary acute pancreatitis without necrosis or infection: Secondary | ICD-10-CM | POA: Diagnosis not present

## 2023-09-27 DIAGNOSIS — K851 Biliary acute pancreatitis without necrosis or infection: Secondary | ICD-10-CM | POA: Diagnosis not present

## 2023-10-10 ENCOUNTER — Encounter (HOSPITAL_COMMUNITY): Payer: Self-pay | Admitting: Surgery

## 2023-10-10 ENCOUNTER — Ambulatory Visit: Admitting: Nurse Practitioner

## 2023-10-10 ENCOUNTER — Other Ambulatory Visit: Payer: Self-pay

## 2023-10-10 NOTE — Progress Notes (Signed)
 SDW CALL  Patient was given pre-op instructions over the phone. The opportunity was given for the patient to ask questions. No further questions asked. Patient verbalized understanding of instructions given.   PCP - NO PCP Cardiologist -   PPM/ICD - denies Device Orders - n/a Rep Notified - n/a  Chest x-ray - 09-15-23 EKG - 09-15-23 Stress Test -  ECHO -  Cardiac Cath -   Sleep Study - denies CPAP - n/a  DM -denies  Blood Thinner Instructions:denies Aspirin Instructions:n/a  ERAS Protcol -clear liquids until 4:30 am PRE-SURGERY Ensure or G2-   COVID TEST-    Anesthesia review: no  Patient denies shortness of breath, fever, cough and chest pain over the phone call   All instructions explained to the patient, with a verbal understanding of the material. Patient agrees to go over the instructions while at home for a better understanding.

## 2023-10-11 NOTE — Anesthesia Preprocedure Evaluation (Addendum)
 Anesthesia Evaluation  Patient identified by MRN, date of birth, ID band Patient awake    Reviewed: Allergy & Precautions, Patient's Chart, lab work & pertinent test results  Airway Mallampati: II  TM Distance: >3 FB Neck ROM: Full    Dental no notable dental hx.    Pulmonary Current Smoker and Patient abstained from smoking.   Pulmonary exam normal        Cardiovascular negative cardio ROS  Rhythm:Regular Rate:Normal     Neuro/Psych negative neurological ROS  negative psych ROS   GI/Hepatic negative GI ROS, Neg liver ROS,,,  Endo/Other  negative endocrine ROS    Renal/GU negative Renal ROS  negative genitourinary   Musculoskeletal  (+) Arthritis , Rheumatoid disorders,    Abdominal Normal abdominal exam  (+)   Peds  Hematology negative hematology ROS (+)   Anesthesia Other Findings   Reproductive/Obstetrics negative OB ROS                             Anesthesia Physical Anesthesia Plan  ASA: 2  Anesthesia Plan: General   Post-op Pain Management: Tylenol  PO (pre-op)*, Toradol  IV (intra-op)*, Ketamine IV* and Dilaudid IV   Induction: Intravenous  PONV Risk Score and Plan: 3 and Ondansetron, Dexamethasone, Midazolam and Treatment may vary due to age or medical condition  Airway Management Planned: Oral ETT and Mask  Additional Equipment: None  Intra-op Plan:   Post-operative Plan: Extubation in OR  Informed Consent: I have reviewed the patients History and Physical, chart, labs and discussed the procedure including the risks, benefits and alternatives for the proposed anesthesia with the patient or authorized representative who has indicated his/her understanding and acceptance.     Dental advisory given  Plan Discussed with: CRNA  Anesthesia Plan Comments:        Anesthesia Quick Evaluation

## 2023-10-12 ENCOUNTER — Ambulatory Visit (HOSPITAL_COMMUNITY)
Admission: RE | Admit: 2023-10-12 | Discharge: 2023-10-12 | Disposition: A | Discharge status: Home / Self Care | Attending: Surgery | Admitting: Surgery

## 2023-10-12 ENCOUNTER — Encounter (HOSPITAL_COMMUNITY)
Admission: RE | Disposition: A | Discharge status: Home / Self Care | Payer: Self-pay | Source: Home / Self Care | Attending: Surgery

## 2023-10-12 ENCOUNTER — Encounter (HOSPITAL_COMMUNITY): Payer: Self-pay | Admitting: Surgery

## 2023-10-12 ENCOUNTER — Ambulatory Visit (HOSPITAL_COMMUNITY): Payer: Self-pay | Admitting: Anesthesiology

## 2023-10-12 ENCOUNTER — Other Ambulatory Visit: Payer: Self-pay

## 2023-10-12 DIAGNOSIS — F17210 Nicotine dependence, cigarettes, uncomplicated: Secondary | ICD-10-CM | POA: Insufficient documentation

## 2023-10-12 DIAGNOSIS — M069 Rheumatoid arthritis, unspecified: Secondary | ICD-10-CM | POA: Insufficient documentation

## 2023-10-12 DIAGNOSIS — K8010 Calculus of gallbladder with chronic cholecystitis without obstruction: Secondary | ICD-10-CM | POA: Diagnosis not present

## 2023-10-12 DIAGNOSIS — K851 Biliary acute pancreatitis without necrosis or infection: Secondary | ICD-10-CM | POA: Diagnosis not present

## 2023-10-12 HISTORY — DX: Rheumatoid arthritis, unspecified: M06.9

## 2023-10-12 HISTORY — PX: CHOLECYSTECTOMY: SHX55

## 2023-10-12 LAB — COMPREHENSIVE METABOLIC PANEL WITH GFR
ALT: 25 U/L (ref 0–44)
AST: 17 U/L (ref 15–41)
Albumin: 3.4 g/dL — ABNORMAL LOW (ref 3.5–5.0)
Alkaline Phosphatase: 72 U/L (ref 38–126)
Anion gap: 9 (ref 5–15)
BUN: 11 mg/dL (ref 6–20)
CO2: 20 mmol/L — ABNORMAL LOW (ref 22–32)
Calcium: 9.1 mg/dL (ref 8.9–10.3)
Chloride: 109 mmol/L (ref 98–111)
Creatinine, Ser: 0.86 mg/dL (ref 0.44–1.00)
GFR, Estimated: >60 mL/min (ref >60)
Glucose, Bld: 104 mg/dL — ABNORMAL HIGH (ref 70–99)
Potassium: 3.7 mmol/L (ref 3.5–5.1)
Sodium: 138 mmol/L (ref 135–145)
Total Bilirubin: 0.9 mg/dL (ref 0.0–1.2)
Total Protein: 5.8 g/dL — ABNORMAL LOW (ref 6.5–8.1)

## 2023-10-12 SURGERY — LAPAROSCOPIC CHOLECYSTECTOMY
Anesthesia: General | Site: Abdomen

## 2023-10-12 MED ORDER — LACTATED RINGERS IV SOLN: INTRAVENOUS | Status: DC

## 2023-10-12 MED ORDER — DOCUSATE SODIUM 100 MG PO CAPS: 100.0000 mg | ORAL_CAPSULE | Freq: Two times a day (BID) | ORAL | 2 refills | Status: AC

## 2023-10-12 MED ORDER — OXYCODONE HCL 5 MG PO TABS: 5.0000 mg | ORAL_TABLET | Freq: Once | ORAL | Status: DC | PRN

## 2023-10-12 MED ORDER — CHLORHEXIDINE GLUCONATE 0.12 % MT SOLN
15.0000 mL | Freq: Once | OROMUCOSAL | Status: AC
  Administered 2023-10-12: 15 mL via OROMUCOSAL
  Filled 2023-10-12: qty 15

## 2023-10-12 MED ORDER — KETAMINE HCL 50 MG/5ML IJ SOSY
PREFILLED_SYRINGE | INTRAMUSCULAR | Status: AC
  Filled 2023-10-12: qty 5

## 2023-10-12 MED ORDER — ROCURONIUM BROMIDE 10 MG/ML (PF) SYRINGE
PREFILLED_SYRINGE | INTRAVENOUS | Status: DC | PRN
  Administered 2023-10-12: 50 mg via INTRAVENOUS

## 2023-10-12 MED ORDER — AMISULPRIDE (ANTIEMETIC) 5 MG/2ML IV SOLN
INTRAVENOUS | Status: AC
  Filled 2023-10-12: qty 4

## 2023-10-12 MED ORDER — CEFAZOLIN SODIUM-DEXTROSE 2-4 GM/100ML-% IV SOLN
2.0000 g | Freq: Once | INTRAVENOUS | Status: AC
  Administered 2023-10-12: 2 g via INTRAVENOUS

## 2023-10-12 MED ORDER — FENTANYL CITRATE (PF) 250 MCG/5ML IJ SOLN
INTRAMUSCULAR | Status: AC
  Filled 2023-10-12: qty 5

## 2023-10-12 MED ORDER — KETAMINE HCL 50 MG/5ML IJ SOSY
PREFILLED_SYRINGE | INTRAMUSCULAR | Status: DC | PRN
Start: 2023-10-12 — End: 2023-10-12
  Administered 2023-10-12: 30 mg via INTRAVENOUS

## 2023-10-12 MED ORDER — FENTANYL CITRATE (PF) 250 MCG/5ML IJ SOLN
INTRAMUSCULAR | Status: DC | PRN
  Administered 2023-10-12: 100 ug via INTRAVENOUS
  Administered 2023-10-12 (×5): 50 ug via INTRAVENOUS

## 2023-10-12 MED ORDER — PROPOFOL 10 MG/ML IV BOLUS
INTRAVENOUS | Status: DC | PRN
  Administered 2023-10-12: 150 mg via INTRAVENOUS
  Administered 2023-10-12: 40 mg via INTRAVENOUS
  Administered 2023-10-12: 50 mg via INTRAVENOUS

## 2023-10-12 MED ORDER — HYDROMORPHONE HCL 1 MG/ML IJ SOLN
INTRAMUSCULAR | Status: AC
  Filled 2023-10-12: qty 1

## 2023-10-12 MED ORDER — ONDANSETRON HCL 4 MG/2ML IJ SOLN
INTRAMUSCULAR | Status: DC | PRN
  Administered 2023-10-12: 4 mg via INTRAVENOUS

## 2023-10-12 MED ORDER — MIDAZOLAM HCL 5 MG/5ML IJ SOLN
INTRAMUSCULAR | Status: DC | PRN
  Administered 2023-10-12: 2 mg via INTRAVENOUS

## 2023-10-12 MED ORDER — SODIUM CHLORIDE (PF) 0.9 % IJ SOLN
INTRAMUSCULAR | Status: AC
  Filled 2023-10-12: qty 10

## 2023-10-12 MED ORDER — PROPOFOL 10 MG/ML IV BOLUS
INTRAVENOUS | Status: AC
  Filled 2023-10-12: qty 20

## 2023-10-12 MED ORDER — KETOROLAC TROMETHAMINE 30 MG/ML IJ SOLN
INTRAMUSCULAR | Status: DC | PRN
Start: 2023-10-12 — End: 2023-10-12
  Administered 2023-10-12: 30 mg via INTRAVENOUS

## 2023-10-12 MED ORDER — SUGAMMADEX SODIUM 200 MG/2ML IV SOLN
INTRAVENOUS | Status: DC | PRN
  Administered 2023-10-12: 200 mg via INTRAVENOUS

## 2023-10-12 MED ORDER — CEFAZOLIN SODIUM 1 G IJ SOLR
INTRAMUSCULAR | Status: AC
  Filled 2023-10-12: qty 20

## 2023-10-12 MED ORDER — BUPIVACAINE HCL (PF) 0.25 % IJ SOLN
INTRAMUSCULAR | Status: AC
Start: 2023-10-12 — End: 2023-10-12
  Filled 2023-10-12: qty 30

## 2023-10-12 MED ORDER — ONDANSETRON HCL 4 MG/2ML IJ SOLN: 4.0000 mg | Freq: Once | INTRAMUSCULAR | Status: DC | PRN

## 2023-10-12 MED ORDER — DEXAMETHASONE SODIUM PHOSPHATE 10 MG/ML IJ SOLN
INTRAMUSCULAR | Status: AC
  Filled 2023-10-12: qty 1

## 2023-10-12 MED ORDER — ACETAMINOPHEN 500 MG PO TABS
1000.0000 mg | ORAL_TABLET | Freq: Once | ORAL | Status: AC
  Administered 2023-10-12: 1000 mg via ORAL
  Filled 2023-10-12: qty 2

## 2023-10-12 MED ORDER — ACETAMINOPHEN 500 MG PO TABS: 1000.0000 mg | ORAL_TABLET | Freq: Four times a day (QID) | ORAL | 3 refills | Status: AC

## 2023-10-12 MED ORDER — AMISULPRIDE (ANTIEMETIC) 5 MG/2ML IV SOLN
10.0000 mg | Freq: Once | INTRAVENOUS | Status: AC | PRN
  Administered 2023-10-12: 10 mg via INTRAVENOUS

## 2023-10-12 MED ORDER — BUPIVACAINE LIPOSOME 1.3 % IJ SUSP
INTRAMUSCULAR | Status: AC
  Filled 2023-10-12: qty 20

## 2023-10-12 MED ORDER — AMISULPRIDE (ANTIEMETIC) 5 MG/2ML IV SOLN: 10.0000 mg | Freq: Once | INTRAVENOUS | Status: DC

## 2023-10-12 MED ORDER — DEXAMETHASONE SODIUM PHOSPHATE 10 MG/ML IJ SOLN
INTRAMUSCULAR | Status: DC | PRN
  Administered 2023-10-12: 5 mg via INTRAVENOUS

## 2023-10-12 MED ORDER — KETOROLAC TROMETHAMINE 30 MG/ML IJ SOLN
INTRAMUSCULAR | Status: AC
  Filled 2023-10-12: qty 1

## 2023-10-12 MED ORDER — HYDROMORPHONE HCL 1 MG/ML IJ SOLN
0.2500 mg | INTRAMUSCULAR | Status: DC | PRN
  Administered 2023-10-12: 0.5 mg via INTRAVENOUS
  Administered 2023-10-12: 0.25 mg via INTRAVENOUS

## 2023-10-12 MED ORDER — LIDOCAINE 2% (20 MG/ML) 5 ML SYRINGE
INTRAMUSCULAR | Status: DC | PRN
  Administered 2023-10-12: 100 mg via INTRAVENOUS

## 2023-10-12 MED ORDER — METHOCARBAMOL 750 MG PO TABS
750.0000 mg | ORAL_TABLET | Freq: Four times a day (QID) | ORAL | 1 refills | Status: AC
Start: 2023-10-12 — End: ?

## 2023-10-12 MED ORDER — PHENYLEPHRINE HCL-NACL 20-0.9 MG/250ML-% IV SOLN
INTRAVENOUS | Status: DC | PRN
  Administered 2023-10-12: 160 ug via INTRAVENOUS

## 2023-10-12 MED ORDER — MIDAZOLAM HCL 2 MG/2ML IJ SOLN
INTRAMUSCULAR | Status: AC
  Filled 2023-10-12: qty 2

## 2023-10-12 MED ORDER — BUPIVACAINE HCL 0.25 % IJ SOLN
INTRAMUSCULAR | Status: DC | PRN
  Administered 2023-10-12: 20 mL

## 2023-10-12 MED ORDER — KETOROLAC TROMETHAMINE 30 MG/ML IJ SOLN: 30.0000 mg | Freq: Once | INTRAMUSCULAR | Status: DC | PRN

## 2023-10-12 MED ORDER — IBUPROFEN 600 MG PO TABS: 600.0000 mg | ORAL_TABLET | Freq: Four times a day (QID) | ORAL | 1 refills | Status: AC | PRN

## 2023-10-12 MED ORDER — OXYCODONE HCL 5 MG/5ML PO SOLN: 5.0000 mg | Freq: Once | ORAL | Status: DC | PRN

## 2023-10-12 MED ORDER — BUPIVACAINE LIPOSOME 1.3 % IJ SUSP
INTRAMUSCULAR | Status: DC | PRN
  Administered 2023-10-12: 20 mL

## 2023-10-12 MED ORDER — ORAL CARE MOUTH RINSE: 15.0000 mL | Freq: Once | OROMUCOSAL | Status: AC

## 2023-10-12 MED ORDER — ONDANSETRON HCL 4 MG/2ML IJ SOLN
INTRAMUSCULAR | Status: AC
  Filled 2023-10-12: qty 2

## 2023-10-12 MED ORDER — 0.9 % SODIUM CHLORIDE (POUR BTL) OPTIME
TOPICAL | Status: DC | PRN
Start: 2023-10-12 — End: 2023-10-12
  Administered 2023-10-12: 1000 mL

## 2023-10-12 MED ORDER — OXYCODONE HCL 5 MG PO TABS: 5.0000 mg | ORAL_TABLET | ORAL | 0 refills | Status: AC | PRN

## 2023-10-12 SURGICAL SUPPLY — 32 items
BLADE CLIPPER SURG (BLADE) IMPLANT
CANISTER SUCTION 3000ML PPV (SUCTIONS) ×1 IMPLANT
CHLORAPREP W/TINT 26 (MISCELLANEOUS) ×1 IMPLANT
CLIP APPLIE 5 13 M/L LIGAMAX5 (MISCELLANEOUS) ×1 IMPLANT
COVER SURGICAL LIGHT HANDLE (MISCELLANEOUS) ×1 IMPLANT
DERMABOND ADVANCED .7 DNX12 (GAUZE/BANDAGES/DRESSINGS) ×1 IMPLANT
DISSECTOR BLUNT TIP ENDO 5MM (MISCELLANEOUS) IMPLANT
ELECT CAUTERY BLADE 6.4 (BLADE) ×1 IMPLANT
ELECTRODE REM PT RTRN 9FT ADLT (ELECTROSURGICAL) ×1 IMPLANT
GLOVE BIO SURGEON STRL SZ 6.5 (GLOVE) ×1 IMPLANT
GLOVE BIOGEL PI IND STRL 6 (GLOVE) ×1 IMPLANT
GOWN STRL REUS W/ TWL LRG LVL3 (GOWN DISPOSABLE) ×3 IMPLANT
IRRIGATION SUCT STRKRFLW 2 WTP (MISCELLANEOUS) IMPLANT
KIT BASIN OR (CUSTOM PROCEDURE TRAY) ×1 IMPLANT
KIT IMAGING PINPOINTPAQ (MISCELLANEOUS) IMPLANT
KIT TURNOVER KIT B (KITS) ×1 IMPLANT
NS IRRIG 1000ML POUR BTL (IV SOLUTION) ×1 IMPLANT
PAD ARMBOARD POSITIONER FOAM (MISCELLANEOUS) ×1 IMPLANT
PENCIL BUTTON HOLSTER BLD 10FT (ELECTRODE) ×1 IMPLANT
POUCH RETRIEVAL ECOSAC 10 (ENDOMECHANICALS) ×1 IMPLANT
SCISSORS LAP 5X35 DISP (ENDOMECHANICALS) ×1 IMPLANT
SET TUBE SMOKE EVAC HIGH FLOW (TUBING) ×1 IMPLANT
SLEEVE Z-THREAD 5X100MM (TROCAR) ×2 IMPLANT
SUT MNCRL AB 4-0 PS2 18 (SUTURE) ×1 IMPLANT
SUT VIC AB 0 UR5 27 (SUTURE) IMPLANT
SUT VICRYL 0 AB UR-6 (SUTURE) IMPLANT
TOWEL GREEN STERILE FF (TOWEL DISPOSABLE) ×1 IMPLANT
TRAY LAPAROSCOPIC MC (CUSTOM PROCEDURE TRAY) ×1 IMPLANT
TROCAR BALLN 12MMX100 BLUNT (TROCAR) ×1 IMPLANT
TROCAR Z-THREAD OPTICAL 5X100M (TROCAR) ×1 IMPLANT
WARMER LAPAROSCOPE (MISCELLANEOUS) ×1 IMPLANT
WATER STERILE IRR 1000ML POUR (IV SOLUTION) ×1 IMPLANT

## 2023-10-12 NOTE — H&P (Signed)
    Jasmine Wade is an 58 y.o. female.   HPI: 11F with h/o GSP. Plan lap chole. The patient has had no hospitalizations, doctors visits, ER visits, surgeries, or newly diagnosed allergies since being seen in the office. Repeat LFTs this AM.    Past Medical History:  Diagnosis Date   Rheumatoid arthritis (HCC)    Vitamin D  deficiency     Past Surgical History:  Procedure Laterality Date   WISDOM TOOTH EXTRACTION      Family History  Problem Relation Age of Onset   Arthritis Mother    Colon polyps Neg Hx    Colon cancer Neg Hx    Esophageal cancer Neg Hx    Rectal cancer Neg Hx    Stomach cancer Neg Hx     Social History:  reports that she has been smoking cigarettes. She has never used smokeless tobacco. She reports current alcohol use. She reports that she does not currently use drugs.  Allergies:  Allergies  Allergen Reactions   Sulfa Antibiotics Other (See Comments)   Tofacitinib Citrate Er Other (See Comments)    Other Reaction(s): Elevated LFTs Xelkanz    Medications: I have reviewed the patient's current medications.  No results found for this or any previous visit (from the past 48 hours).  No results found.  ROS 10 point review of systems is negative except as listed above in HPI.   Physical Exam Blood pressure 127/80, pulse 96, temperature 97.9 F (36.6 C), temperature source Oral, resp. rate 18, height 5' 5 (1.651 m), weight 73.9 kg, SpO2 100%. Constitutional: well-developed, well-nourished HEENT: pupils equal, round, reactive to light, 2mm b/l, moist conjunctiva, external inspection of ears and nose normal, hearing intact Oropharynx: normal oropharyngeal mucosa, normal dentition Neck: no thyromegaly, trachea midline, no midline cervical tenderness to palpation Chest: breath sounds equal bilaterally, normal respiratory effort, no midline or lateral chest wall tenderness to palpation/deformity Abdomen: soft, NT, no bruising, no  hepatosplenomegaly Skin: warm, dry, no rashes Psych: normal memory, normal mood/affect     Assessment/Plan: H/o GSP - plan lap chole. Informed consent was obtained after detailed explanation of risks, including bleeding, infection, biloma, hematoma, injury to common bile duct, need for IOC to delineate anatomy, and need for conversion to open procedure. All questions answered to the patient's satisfaction. FEN - strict NPO DVT - SCDs, Dispo - plan home post-op    Anda Bamberg, MD General and Trauma Surgery [facility]

## 2023-10-12 NOTE — Op Note (Signed)
   Operative Note  Date: 10/12/2023  Procedure: laparoscopic cholecystectomy  Pre-op diagnosis: history of gallstone pancreatitis Post-op diagnosis: same  Indication and clinical history: The patient is a 58 y.o. year old female with history of gallstone pancreatitis  Surgeon: Anda Bamberg, MD  Anesthesiologist: Lavada Porteous, MD Anesthesia: General  Findings:  Specimen: gallbladder EBL: <5cc Drains/Implants: none  Disposition: PACU - hemodynamically stable.  Description of procedure: The patient was positioned supine on the operating room table. Time-out was performed verifying correct patient, procedure, signature of informed consent, and administration of pre-operative antibiotics. General anesthetic induction and intubation were uneventful. The abdomen was prepped and draped in the usual sterile fashion. An infra-umbilical incision was made using an open technique using zero vicryl stay sutures on either side of the fascia and a 10mm Hassan port inserted. After establishing pneumoperitoneum, which the patient tolerated well, the abdominal cavity was inspected and no injury of any intra-abdominal structures was identified. Additional ports were placed under direct visualization and using local anesthetic: two 5mm ports in the right subcostal region and a 5mm port in the epigastric region. The patient was re-positioned to reverse Trendelenburg and right side up. Adhesiolysis was performed to expose the gallbladder, which was then retracted cephalad. The infundibulum was identified and retracted toward the right lower quadrant. The peritoneum was incised over the infundibulum and the triangle of Calot dissected to expose the critical view of safety. With clear identification and isolation of the cystic duct and cystic artery, the cystic artery was doubly clipped and divided. After this, the cystic duct was identified as a single structure entering the gallbladder, and was also doubly clipped  and divided. The gallbladder was dissected off the liver bed using electrocautery and hemostasis of the liver bed was confirmed prior to separation of the final peritoneal attachments of the gallbladder to the liver bed. After transection of the final peritoneal attachments, the gallbladder was placed in an endoscopic specimen retrieval bag, removed via the umbilical port site, and sent to pathology as a permanent specimen. The gallbladder fossa was inspected confirming hemostasis, the absence of bile leakage from the cystic duct stump, and correct placement of clips on the cystic artery and cystic duct stumps. The abdomen was desufflated and the fascia of the umbilical port site was closed using the previously placed stay sutures. Additional local anesthetic was administered at the umbilical port site.  The skin of all incisions was closed with 4-0 monocryl. Sterile dressings were applied. All sponge and instrument counts were correct at the conclusion of the procedure. The patient was awakened from anesthesia, extubated uneventfully, and transported to the PACU - hemodynamically stable.. There were no complications.    Anda Bamberg, MD General and Trauma Surgery [facility]

## 2023-10-12 NOTE — Transfer of Care (Signed)
 Immediate Anesthesia Transfer of Care Note  Patient: Jasmine Wade  Procedure(s) Performed: LAPAROSCOPIC CHOLECYSTECTOMY (Abdomen)  Patient Location: PACU  Anesthesia Type:General  Level of Consciousness: awake, alert , oriented, and patient cooperative  Airway & Oxygen Therapy: Patient Spontanous Breathing and Patient connected to nasal cannula oxygen  Post-op Assessment: Report given to RN, Post -op Vital signs reviewed and stable, and Patient moving all extremities X 4  Post vital signs: Reviewed and stable  Last Vitals:  Vitals Value Taken Time  BP 149/94 10/12/23 08:46  Temp 36.5 C 10/12/23 08:45  Pulse 80 10/12/23 08:53  Resp 13 10/12/23 08:53  SpO2 100 % 10/12/23 08:53  Vitals shown include unfiled device data.  Last Pain:  Vitals:   10/12/23 0845  TempSrc:   PainSc: 0-No pain         Complications: No notable events documented.

## 2023-10-12 NOTE — Discharge Instructions (Signed)
 [facility]  LAPAROSCOPIC SURGERY: POST OP INSTRUCTIONS Always review your discharge instruction sheet given to you by the facility where your surgery was performed. IF YOU HAVE DISABILITY OR FAMILY LEAVE FORMS, YOU MUST BRING THEM TO THE OFFICE FOR PROCESSING.   DO NOT GIVE THEM TO YOUR DOCTOR.  PAIN CONTROL  Pain regimen: take over-the-counter tylenol  (acetaminophen ) 1000mg  every six hours, the prescription ibuprofen (600mg ) every six hours and the robaxin (methocarbamol) 750mg  every six hours. With all three of these, you should be taking something every two hours. Example: tylenol  (acetaminophen ) at 8am, ibuprofen at 10am, robaxin (methocarbamol) at 12pm, tylenol  (acetaminophen ) again at 2pm, ibuprofen again at 4pm, robaxin (methocarbamol) at 6pm. You also have a prescription for oxycodone , which should be taken if the tylenol  (acetaminophen ), ibuprofen, and robaxin (methocarbamol) are not enough to control your pain. You may take the oxycodone  as frequently as every four hours as needed, but if you are taking the other medications as above, you should not need the oxycodone  this frequently. You have also been given a prescription for colace (docusate) which is a stool softener. Please take this as prescribed because the oxycodone  can cause constipation and the colace (docusate) will minimize or prevent constipation. Do not drive while taking or under the influence of the oxycodone  as it is a narcotic medication. Use ice packs to help control pain. If you need a refill on your pain medication, please contact your pharmacy.  They will contact our office to request authorization. Prescriptions will not be filled after 5pm or on week-ends.  HOME MEDICATIONS Take your usually prescribed medications unless otherwise directed.  DIET You should follow a light diet the first few days after arrival home.  Be sure to include lots of fluids daily.  Do not consume alcohol while  taking oxycodone  or ibuprofen.   CONSTIPATION It is common to experience some constipation after surgery and if you are taking pain medication.  Increasing fluid intake and taking a stool softener (such as Colace) will usually help or prevent this problem from occurring.  A mild laxative (Miralax, over-the counter) should be taken according to package instructions if there are no bowel movements after 48 hours. If still no bowel movement 24 hours after taking Miralax, you may try magnesium citrate, available over the counter at a local pharmacy.   WOUND/INCISION CARE Most patients will experience some swelling and bruising in the area of the incisions.  Ice packs will help.  Swelling and bruising can take several days to resolve.  May shower beginning 10/13/2023.  Do not peel off or scrub skin glue. May allow warm soapy water to run over incision, then rinse and pat dry.  Do not soak in any water (tubs, hot tubs, pools, lakes, oceans) for one week.   ACTIVITIES You may resume regular (light) daily activities beginning the next day--such as daily self-care, walking, climbing stairs--gradually increasing activities as tolerated.  You may have sexual intercourse when it is comfortable.   No lifting greater than 5 pounds for six weeks.  You may drive when you are no longer taking narcotic pain medication, you can comfortably wear a seatbelt, and you can safely maneuver your car and apply brakes.  FOLLOW-UP You should see your doctor in the office for a follow-up appointment approximately 2-3 weeks after your surgery.  You should have been given your post-op/follow-up appointment when your surgery was scheduled.  If you did not receive a post-op/follow-up appointment, make sure that you  call for this appointment within a day or two after you arrive home to ensure a convenient appointment time.  WHEN TO CALL YOUR DOCTOR: Fever over 101.5 Inability to urinate Continued bleeding from  incision. Increased pain, redness, or drainage from the incision. Increasing abdominal pain  The clinic staff is available to answer your questions during regular business hours.  Please don't hesitate to call and ask to speak to one of the nurses for clinical concerns.  If you have a medical emergency, go to the nearest emergency room or call 911.  A surgeon from [facility] is always on call at the hospital. [address], [place], [place]  [address] ? [address] [address], [place], [place]   [address] [phone] ? [phone] ? FAX [phone] Web site: [email or web address]

## 2023-10-12 NOTE — Anesthesia Postprocedure Evaluation (Signed)
 Anesthesia Post Note  Patient: Jasmine Wade  Procedure(s) Performed: LAPAROSCOPIC CHOLECYSTECTOMY (Abdomen)     Patient location during evaluation: PACU Anesthesia Type: General Level of consciousness: awake and alert Pain management: pain level controlled Vital Signs Assessment: post-procedure vital signs reviewed and stable Respiratory status: spontaneous breathing, nonlabored ventilation, respiratory function stable and patient connected to nasal cannula oxygen Cardiovascular status: blood pressure returned to baseline and stable Postop Assessment: no apparent nausea or vomiting Anesthetic complications: no   No notable events documented.  Last Vitals:  Vitals:   10/12/23 0900 10/12/23 0915  BP: 137/62 132/62  Pulse: 72 66  Resp: 18 10  Temp:  36.5 C  SpO2: 100% 100%    Last Pain:  Vitals:   10/12/23 0915  TempSrc:   PainSc: 2                  Valente Gaskin Demarkis Gheen

## 2023-10-12 NOTE — Anesthesia Procedure Notes (Signed)
 Procedure Name: Intubation Date/Time: 10/12/2023 7:35 AM  Performed by: Colen Daunt, CRNAPre-anesthesia Checklist: Patient identified, Emergency Drugs available, Suction available and Patient being monitored Patient Re-evaluated:Patient Re-evaluated prior to induction Oxygen Delivery Method: Circle system utilized Preoxygenation: Pre-oxygenation with 100% oxygen Induction Type: IV induction Ventilation: Mask ventilation without difficulty Laryngoscope Size: Miller and 2 Grade View: Grade I Tube type: Oral Tube size: 7.0 mm Number of attempts: 1 Airway Equipment and Method: Stylet Placement Confirmation: ETT inserted through vocal cords under direct vision, positive ETCO2 and breath sounds checked- equal and bilateral Secured at: 22 cm Tube secured with: Tape Dental Injury: Teeth and Oropharynx as per pre-operative assessment

## 2023-10-13 ENCOUNTER — Encounter (HOSPITAL_COMMUNITY): Payer: Self-pay | Admitting: Surgery

## 2023-10-15 LAB — SURGICAL PATHOLOGY

## 2023-10-18 DIAGNOSIS — R7989 Other specified abnormal findings of blood chemistry: Secondary | ICD-10-CM | POA: Diagnosis not present

## 2023-10-25 DIAGNOSIS — R82998 Other abnormal findings in urine: Secondary | ICD-10-CM | POA: Diagnosis not present

## 2023-11-19 DIAGNOSIS — R5382 Chronic fatigue, unspecified: Secondary | ICD-10-CM | POA: Diagnosis not present

## 2023-11-19 DIAGNOSIS — R7989 Other specified abnormal findings of blood chemistry: Secondary | ICD-10-CM | POA: Diagnosis not present

## 2023-11-19 DIAGNOSIS — M0579 Rheumatoid arthritis with rheumatoid factor of multiple sites without organ or systems involvement: Secondary | ICD-10-CM | POA: Diagnosis not present

## 2023-11-19 DIAGNOSIS — K449 Diaphragmatic hernia without obstruction or gangrene: Secondary | ICD-10-CM | POA: Diagnosis not present

## 2023-12-19 ENCOUNTER — Ambulatory Visit
Admission: RE | Admit: 2023-12-19 | Discharge: 2023-12-19 | Disposition: A | Discharge status: Home / Self Care | Payer: Self-pay | Source: Ambulatory Visit | Attending: Emergency Medicine | Admitting: Emergency Medicine

## 2023-12-19 VITALS — BP 121/70 | HR 88 | Temp 98.3°F | Resp 18

## 2023-12-19 DIAGNOSIS — R350 Frequency of micturition: Secondary | ICD-10-CM | POA: Diagnosis not present

## 2023-12-19 DIAGNOSIS — R300 Dysuria: Secondary | ICD-10-CM | POA: Diagnosis not present

## 2023-12-19 DIAGNOSIS — R102 Pelvic and perineal pain: Secondary | ICD-10-CM | POA: Diagnosis not present

## 2023-12-19 LAB — POCT URINE DIPSTICK
Glucose, UA: NEGATIVE mg/dL
Ketones, POC UA: NEGATIVE mg/dL
Leukocytes, UA: NEGATIVE
Nitrite, UA: NEGATIVE
POC PROTEIN,UA: 30 — AB
Spec Grav, UA: >=1.03 — AB (ref 1.010–1.025)
Urobilinogen, UA: 0.2 U/dL
pH, UA: 5.5 (ref 5.0–8.0)

## 2023-12-19 MED ORDER — NITROFURANTOIN MONOHYD MACRO 100 MG PO CAPS: 100.0000 mg | ORAL_CAPSULE | Freq: Two times a day (BID) | ORAL | 0 refills | Status: AC

## 2023-12-19 NOTE — ED Provider Notes (Addendum)
 Jasmine Wade    CSN: [id number] Arrival date & time: 12/19/23  0802      History   Chief Complaint Chief Complaint  Patient presents with   Urinary Frequency    I think I have a UTI - Entered by patient   Dysuria   Flank Pain    HPI Jasmine Wade is a 58 y.o. female.   Patient presents for evaluation of urinary frequency, dysuria, incomplete bladder emptying, lower abdominal pressure primarily with urination and right lower back pain beginning 1 day ago.  Associated vaginal pain with mild vaginal itching, endorses this is more so a discomfort.  Unsure of presence of hematuria.  Has attempted use of ibuprofen .  No concern for STI.  Postmenopausal.  Denies vaginal discharge and odor.  Past Medical History:  Diagnosis Date   Rheumatoid arthritis (HCC)    Vitamin D  deficiency     Patient Active Problem List   Diagnosis Date Noted   Abnormal Pap smear of cervix 04/01/2018    Past Surgical History:  Procedure Laterality Date   CHOLECYSTECTOMY N/A 10/12/2023   Procedure: LAPAROSCOPIC CHOLECYSTECTOMY;  Surgeon: Jasmine Dreama SAILOR, MD;  Location: [facility] OR;  Service: General;  Laterality: N/A;   WISDOM TOOTH EXTRACTION      OB History   No obstetric history on file.      Home Medications    Prior to Admission medications   Medication Sig Start Date End Date Taking? Authorizing Provider  nitrofurantoin , macrocrystal-monohydrate, (MACROBID ) 100 MG capsule Take 1 capsule (100 mg total) by mouth 2 (two) times daily. 12/19/23  Yes Lucky Trotta R, NP  acetaminophen  (TYLENOL ) 500 MG tablet Take 2 tablets (1,000 mg total) by mouth 4 (four) times daily. 10/12/23 10/11/24  Jasmine Dreama SAILOR, MD  cholecalciferol (VITAMIN D3) 25 MCG (1000 UNIT) tablet Take 1,000 Units by mouth daily.    [provider]  clobetasol cream (TEMOVATE) 0.05 % Apply 1 Application topically every other day.    [provider]  docusate sodium  (COLACE) 100 MG capsule Take 1 capsule  (100 mg total) by mouth 2 (two) times daily. 10/12/23 10/11/24  Jasmine Dreama SAILOR, MD  ibuprofen  (ADVIL ) 600 MG tablet Take 1 tablet (600 mg total) by mouth every 6 (six) hours as needed. 10/12/23   Jasmine Dreama SAILOR, MD  methocarbamol  (ROBAXIN ) 750 MG tablet Take 1 tablet (750 mg total) by mouth 4 (four) times daily. 10/12/23   Jasmine Dreama SAILOR, MD  oxyCODONE  (ROXICODONE ) 5 MG immediate release tablet Take 1 tablet (5 mg total) by mouth every 4 (four) hours as needed. 10/12/23   Jasmine Dreama SAILOR, MD  predniSONE  (DELTASONE ) 5 MG tablet Take 10 mg by mouth daily with breakfast. 09/20/23   [provider]  XELJANZ XR 11 MG TB24 Take 1 tablet by mouth daily.    [provider]    Family History Family History  Problem Relation Age of Onset   Arthritis Mother    Colon polyps Neg Hx    Colon cancer Neg Hx    Esophageal cancer Neg Hx    Rectal cancer Neg Hx    Stomach cancer Neg Hx     Social History Social History   Tobacco Use   Smoking status: Every Day    Types: Cigarettes   Smokeless tobacco: Never  Vaping Use   Vaping status: Never Used  Substance Use Topics   Alcohol use: Yes    Comment: occ   Drug use: Not Currently  Comment: back in her 20's      Allergies   Sulfa antibiotics and Tofacitinib citrate er   Review of Systems Review of Systems   Physical Exam Triage Vital Signs ED Triage Vitals  Encounter Vitals Group     BP 12/19/23 0812 121/70     Girls Systolic BP Percentile --      Girls Diastolic BP Percentile --      Boys Systolic BP Percentile --      Boys Diastolic BP Percentile --      Pulse Rate 12/19/23 0812 88     Resp 12/19/23 0812 18     Temp 12/19/23 0812 98.3 F (36.8 C)     Temp Source 12/19/23 0812 Oral     SpO2 12/19/23 0812 99 %     Weight --      Height --      Head Circumference --      Peak Flow --      Pain Score 12/19/23 0816 5     Pain Loc --      Pain Education --      Exclude from Growth Chart --    No data  found.  Updated Vital Signs BP 121/70 (BP Location: Left Arm)   Pulse 88   Temp 98.3 F (36.8 C) (Oral)   Resp 18   SpO2 99%   Visual Acuity Right Eye Distance:   Left Eye Distance:   Bilateral Distance:    Right Eye Near:   Left Eye Near:    Bilateral Near:     Physical Exam Constitutional:      Appearance: Normal appearance.  Eyes:     Extraocular Movements: Extraocular movements intact.  Cardiovascular:     Rate and Rhythm: Normal rate and regular rhythm.     Pulses: Normal pulses.     Heart sounds: Normal heart sounds.  Pulmonary:     Effort: Pulmonary effort is normal.     Breath sounds: Normal breath sounds.  Abdominal:     Tenderness: There is no abdominal tenderness. There is no right CVA tenderness, left CVA tenderness or guarding.  Genitourinary:    Comments: deferred Neurological:     Mental Status: She is alert and oriented to person, place, and time. Mental status is at baseline.      UC Treatments / Results  Labs (all labs ordered are listed, but only abnormal results are displayed) Labs Reviewed  URINE CULTURE  POCT URINE DIPSTICK    EKG   Radiology No results found.  Procedures Procedures (including critical care time)  Medications Ordered in UC Medications - No data to display  Initial Impression / Assessment and Plan / UC Course  I have reviewed the triage vital signs and the nursing notes.  Pertinent labs & imaging results that were available during my care of the patient were reviewed by me and considered in my medical decision making (see chart for details).  Urinary frequency  Urinalysis showing leukocytes and hemoglobin, negative for nitrates, sent for culture, declined testing for BV and yeast, advised if vaginal itching continues to return to clinic for additional assessment,  empirically placed on Macrobid  as she is symptomatic, 5-day course, no prior urine culture available, recommended over-the-counter medication such as  Pyridium and nonpharmacological measures for supportive care, advised follow-up if symptoms persist worsen or recur Final Clinical Impressions(s) / UC Diagnoses   Final diagnoses:  Urinary frequency     Discharge Instructions  Your urinalysis shows Rhilyn Battle blood cells and microscopic blood but does not show bacteria at this time, your urine will be sent to the lab to determine exactly which bacteria is present, if any changes need to be made to your medications you will be notified  Begin use of Macrobid  twice daily for 5 days  You may use over-the-counter Pyridium to help minimize your symptoms until antibiotic removes bacteria, this medication will turn your urine orange  Increase your fluid intake through use of water  As always practice good hygiene, wiping front to back and avoidance of scented vaginal products to prevent further irritation  If symptoms continue to persist after use of medication or recur please follow-up with urgent care or your primary doctor as needed    ED Prescriptions     Medication Sig Dispense Auth. Provider   nitrofurantoin , macrocrystal-monohydrate, (MACROBID ) 100 MG capsule Take 1 capsule (100 mg total) by mouth 2 (two) times daily. 10 capsule Jelani Vreeland R, NP      PDMP not reviewed this encounter.   Teresa Shelba SAUNDERS, NP 12/19/23 0836    Teresa Shelba SAUNDERS, NP 12/19/23 [phone]

## 2023-12-19 NOTE — ED Triage Notes (Signed)
 Patient complains of pain in vaginal area that started yesterday. Patient reports then she started urinating every 5 minutes. Patient also reports painful urination with burning sensation and pressure. Patient also complains of right flank pain. Rates 5/10. Patient took Ibuprofen  600 mg PO at 10:00 pm yesterday. Patient also took cranberry supplement on yesterday. Patient reports mild relief with interventions.

## 2023-12-19 NOTE — Discharge Instructions (Addendum)
 Your urinalysis shows Jasmine Wade blood cells and microscopic blood but does not show bacteria at this time, your urine will be sent to the lab to determine exactly which bacteria is present, if any changes need to be made to your medications you will be notified  Begin use of Macrobid  twice daily for 5 days  You may use over-the-counter Pyridium to help minimize your symptoms until antibiotic removes bacteria, this medication will turn your urine orange  Increase your fluid intake through use of water  As always practice good hygiene, wiping front to back and avoidance of scented vaginal products to prevent further irritation  If symptoms continue to persist after use of medication or recur please follow-up with urgent care or your primary doctor as needed

## 2023-12-20 ENCOUNTER — Ambulatory Visit (HOSPITAL_COMMUNITY): Payer: Self-pay

## 2023-12-20 LAB — URINE CULTURE: Culture: NO GROWTH

## 2023-12-25 DIAGNOSIS — R300 Dysuria: Secondary | ICD-10-CM | POA: Diagnosis not present

## 2024-01-19 ENCOUNTER — Telehealth: Admitting: Nurse Practitioner

## 2024-01-19 DIAGNOSIS — U071 COVID-19: Secondary | ICD-10-CM | POA: Diagnosis not present

## 2024-01-19 MED ORDER — NIRMATRELVIR/RITONAVIR (PAXLOVID)TABLET: 3.0000 | ORAL_TABLET | Freq: Two times a day (BID) | ORAL | 0 refills | Status: AC

## 2024-01-19 NOTE — Progress Notes (Signed)
 Virtual Visit Consent   Jasmine Wade, you are scheduled for a virtual visit with a [facility] provider today. Just as with appointments in the office, your consent must be obtained to participate. Your consent will be active for this visit and any virtual visit you may have with one of our providers in the next 365 days. If you have a MyChart account, a copy of this consent can be sent to you electronically.  As this is a virtual visit, video technology does not allow for your provider to perform a traditional examination. This may limit your provider's ability to fully assess your condition. If your provider identifies any concerns that need to be evaluated in person or the need to arrange testing (such as labs, EKG, etc.), we will make arrangements to do so. Although advances in technology are sophisticated, we cannot ensure that it will always work on either your end or our end. If the connection with a video visit is poor, the visit may have to be switched to a telephone visit. With either a video or telephone visit, we are not always able to ensure that we have a secure connection.  By engaging in this virtual visit, you consent to the provision of healthcare and authorize for your insurance to be billed (if applicable) for the services provided during this visit. Depending on your insurance coverage, you may receive a charge related to this service.  I need to obtain your verbal consent now. Are you willing to proceed with your visit today? Jasmine Wade has provided verbal consent on 01/19/2024 for a virtual visit (video or telephone). Jasmine LELON Servant, NP  Date: 01/19/2024 12:39 PM   Virtual Visit via Video Note   I, Jasmine Wade, connected with  Jasmine Wade  ([id number], 1966/01/14) on 01/19/24 at 12:45 PM EDT by a video-enabled telemedicine application and verified that I am speaking with the correct person using two identifiers.  Location: Patient: Virtual Visit Location  Patient: Home Provider: Virtual Visit Location Provider: Home Office   I discussed the limitations of evaluation and management by telemedicine and the availability of in person appointments. The patient expressed understanding and agreed to proceed.    History of Present Illness: Jasmine Wade is a 58 y.o. who identifies as a female who was assigned female at birth, and is being seen today for COVID positive symptoms.  [name] started experiencing symptoms of Low grade fever 99.5, diarrhea, fatigue, headache, sinus pressure 2 days ago. Yesterday she tested positive for COVID.  Currently not taking Xeljanz.  Taking 600 mg ibuprofen  for her symptoms.  Requesting Paxlovid  today due to her compromised immune system.  Problems:  Patient Active Problem List   Diagnosis Date Noted   Abnormal Pap smear of cervix 04/01/2018    Allergies:  Allergies  Allergen Reactions   Sulfa Antibiotics Other (See Comments)   Tofacitinib Citrate Er Other (See Comments)    Other Reaction(s): Elevated LFTs Xelkanz   Medications:  Current Outpatient Medications:    nirmatrelvir /ritonavir  (PAXLOVID ) 20 x 150 MG & 10 x 100MG  TABS, Take 3 tablets by mouth 2 (two) times daily for 5 days. (Take nirmatrelvir  150 mg two tablets twice daily for 5 days and ritonavir  100 mg one tablet twice daily for 5 days) Patient GFR is >60, Disp: 30 tablet, Rfl: 0   acetaminophen  (TYLENOL ) 500 MG tablet, Take 2 tablets (1,000 mg total) by mouth 4 (four) times daily., Disp: 120 tablet, Rfl: 3  cholecalciferol (VITAMIN D3) 25 MCG (1000 UNIT) tablet, Take 1,000 Units by mouth daily., Disp: , Rfl:    clobetasol cream (TEMOVATE) 0.05 %, Apply 1 Application topically every other day., Disp: , Rfl:    docusate sodium  (COLACE) 100 MG capsule, Take 1 capsule (100 mg total) by mouth 2 (two) times daily., Disp: 60 capsule, Rfl: 2   ibuprofen  (ADVIL ) 600 MG tablet, Take 1 tablet (600 mg total) by mouth every 6 (six) hours as needed., Disp:  120 tablet, Rfl: 1   methocarbamol  (ROBAXIN ) 750 MG tablet, Take 1 tablet (750 mg total) by mouth 4 (four) times daily., Disp: 120 tablet, Rfl: 1   nitrofurantoin , macrocrystal-monohydrate, (MACROBID ) 100 MG capsule, Take 1 capsule (100 mg total) by mouth 2 (two) times daily., Disp: 10 capsule, Rfl: 0   oxyCODONE  (ROXICODONE ) 5 MG immediate release tablet, Take 1 tablet (5 mg total) by mouth every 4 (four) hours as needed., Disp: 15 tablet, Rfl: 0   predniSONE  (DELTASONE ) 5 MG tablet, Take 10 mg by mouth daily with breakfast., Disp: , Rfl:    XELJANZ XR 11 MG TB24, Take 1 tablet by mouth daily., Disp: , Rfl:   Observations/Objective: Patient is well-developed, well-nourished in no acute distress.  Resting comfortably at home.  Head is normocephalic, atraumatic.  No labored breathing.  Speech is clear and coherent with logical content.  Patient is alert and oriented at baseline.    Assessment and Plan: 1. Positive self-administered antigen test for COVID-19 (Primary) - nirmatrelvir /ritonavir  (PAXLOVID ) 20 x 150 MG & 10 x 100MG  TABS; Take 3 tablets by mouth 2 (two) times daily for 5 days. (Take nirmatrelvir  150 mg two tablets twice daily for 5 days and ritonavir  100 mg one tablet twice daily for 5 days) Patient GFR is >60  Dispense: 30 tablet; Refill: 0   Please keep well-hydrated and get plenty of rest. Start a saline nasal rinse to flush out your nasal passages. You can use plain Mucinex to help thin congestion. If you have a humidifier, you can use this daily as needed.    You are to wear a mask for 5 days from onset of your symptoms.  After day 5, if you have had no fever and you are feeling better with NO symptoms, you can end masking. Keep in mind you can be contagious 10 days from the onset of symptoms  After day 5 if you have a fever or are having significant symptoms, please wear your mask for full 10 days.   If you note any worsening of symptoms, any significant shortness of  breath or any chest pain, please seek ER evaluation ASAP.  Please do not delay care!    If you note any worsening of symptoms, any significant shortness of breath or any chest pain, please seek ER evaluation ASAP.  Please do not delay care!   Follow Up Instructions: I discussed the assessment and treatment plan with the patient. The patient was provided an opportunity to ask questions and all were answered. The patient agreed with the plan and demonstrated an understanding of the instructions.  A copy of instructions were sent to the patient via MyChart unless otherwise noted below.     The patient was advised to call back or seek an in-person evaluation if the symptoms worsen or if the condition fails to improve as anticipated.    Barnet Benavides W Tyrrell Stephens, NP

## 2024-01-19 NOTE — Patient Instructions (Signed)
 Sari JONELLE Church, thank you for joining Haze LELON Servant, NP for today's virtual visit.  While this provider is not your primary care provider (PCP), if your PCP is located in our provider database this encounter information will be shared with them immediately following your visit.   A [facility] MyChart account gives you access to today's visit and all your visits, tests, and labs performed at [facility]  click here if you don't have a [facility] MyChart account or go to [email or web address]  Consent: (Patient) Jasmine Wade provided verbal consent for this virtual visit at the beginning of the encounter.  Current Medications:  Current Outpatient Medications:    nirmatrelvir /ritonavir  (PAXLOVID ) 20 x 150 MG & 10 x 100MG  TABS, Take 3 tablets by mouth 2 (two) times daily for 5 days. (Take nirmatrelvir  150 mg two tablets twice daily for 5 days and ritonavir  100 mg one tablet twice daily for 5 days) Patient GFR is >60, Disp: 30 tablet, Rfl: 0   acetaminophen  (TYLENOL ) 500 MG tablet, Take 2 tablets (1,000 mg total) by mouth 4 (four) times daily., Disp: 120 tablet, Rfl: 3   cholecalciferol (VITAMIN D3) 25 MCG (1000 UNIT) tablet, Take 1,000 Units by mouth daily., Disp: , Rfl:    clobetasol cream (TEMOVATE) 0.05 %, Apply 1 Application topically every other day., Disp: , Rfl:    docusate sodium  (COLACE) 100 MG capsule, Take 1 capsule (100 mg total) by mouth 2 (two) times daily., Disp: 60 capsule, Rfl: 2   ibuprofen  (ADVIL ) 600 MG tablet, Take 1 tablet (600 mg total) by mouth every 6 (six) hours as needed., Disp: 120 tablet, Rfl: 1   methocarbamol  (ROBAXIN ) 750 MG tablet, Take 1 tablet (750 mg total) by mouth 4 (four) times daily., Disp: 120 tablet, Rfl: 1   nitrofurantoin , macrocrystal-monohydrate, (MACROBID ) 100 MG capsule, Take 1 capsule (100 mg total) by mouth 2 (two) times daily., Disp: 10 capsule, Rfl: 0   oxyCODONE  (ROXICODONE ) 5 MG immediate release tablet, Take 1 tablet (5  mg total) by mouth every 4 (four) hours as needed., Disp: 15 tablet, Rfl: 0   predniSONE  (DELTASONE ) 5 MG tablet, Take 10 mg by mouth daily with breakfast., Disp: , Rfl:    XELJANZ XR 11 MG TB24, Take 1 tablet by mouth daily., Disp: , Rfl:    Medications ordered in this encounter:  Meds ordered this encounter  Medications   nirmatrelvir /ritonavir  (PAXLOVID ) 20 x 150 MG & 10 x 100MG  TABS    Sig: Take 3 tablets by mouth 2 (two) times daily for 5 days. (Take nirmatrelvir  150 mg two tablets twice daily for 5 days and ritonavir  100 mg one tablet twice daily for 5 days) Patient GFR is >60    Dispense:  30 tablet    Refill:  0    Supervising Provider:   BLAISE ALEENE KIDD [phone]     *If you need refills on other medications prior to your next appointment, please contact your pharmacy*  Follow-Up: Call back or seek an in-person evaluation if the symptoms worsen or if the condition fails to improve as anticipated.  [facility] [phone]  Other Instructions  Please keep well-hydrated and get plenty of rest. Start a saline nasal rinse to flush out your nasal passages. You can use plain Mucinex to help thin congestion. If you have a humidifier, you can use this daily as needed.    You are to wear a mask for 5 days from onset of your symptoms.  After day 5, if you have had no fever and you are feeling better with NO symptoms, you can end masking. Keep in mind you can be contagious 10 days from the onset of symptoms  After day 5 if you have a fever or are having significant symptoms, please wear your mask for full 10 days.   If you note any worsening of symptoms, any significant shortness of breath or any chest pain, please seek ER evaluation ASAP.  Please do not delay care!    If you note any worsening of symptoms, any significant shortness of breath or any chest pain, please seek ER evaluation ASAP.  Please do not delay care!    If you have been instructed to have an  in-person evaluation today at a local Urgent Care facility, please use the link below. It will take you to a list of all of our available [facility], including address, phone number and hours of operation. Please do not delay care.  [facility]  If you or a family member do not have a primary care provider, use the link below to schedule a visit and establish care. When you choose a [facility] primary care physician or advanced practice provider, you gain a long-term partner in health. Find a Primary Care Provider  Learn more about [facility]'s in-office and virtual care options:  - Get Care Now

## 2024-02-07 DIAGNOSIS — R5382 Chronic fatigue, unspecified: Secondary | ICD-10-CM | POA: Diagnosis not present

## 2024-02-07 DIAGNOSIS — R7989 Other specified abnormal findings of blood chemistry: Secondary | ICD-10-CM | POA: Diagnosis not present

## 2024-02-07 DIAGNOSIS — M0579 Rheumatoid arthritis with rheumatoid factor of multiple sites without organ or systems involvement: Secondary | ICD-10-CM | POA: Diagnosis not present

## 2024-03-19 DIAGNOSIS — Z01419 Encounter for gynecological examination (general) (routine) without abnormal findings: Secondary | ICD-10-CM | POA: Diagnosis not present

## 2024-03-19 DIAGNOSIS — Z124 Encounter for screening for malignant neoplasm of cervix: Secondary | ICD-10-CM | POA: Diagnosis not present

## 2024-03-19 DIAGNOSIS — Z1231 Encounter for screening mammogram for malignant neoplasm of breast: Secondary | ICD-10-CM | POA: Diagnosis not present
